# Patient Record
Sex: Female | Born: 1970 | Race: White | Hispanic: No | Marital: Married | Smoking: Never smoker
Health system: Southern US, Community
[De-identification: ages and names within clinical notes are randomized; demographics above are authoritative.]

## PROBLEM LIST (undated history)

## (undated) DIAGNOSIS — F32A Depression, unspecified: Secondary | ICD-10-CM

## (undated) DIAGNOSIS — J40 Bronchitis, not specified as acute or chronic: Secondary | ICD-10-CM

## (undated) DIAGNOSIS — F419 Anxiety disorder, unspecified: Secondary | ICD-10-CM

## (undated) DIAGNOSIS — D649 Anemia, unspecified: Secondary | ICD-10-CM

## (undated) DIAGNOSIS — F329 Major depressive disorder, single episode, unspecified: Secondary | ICD-10-CM

## (undated) HISTORY — PX: NO PAST SURGERIES: SHX2092

## (undated) HISTORY — DX: Depression, unspecified: F32.A

## (undated) HISTORY — DX: Major depressive disorder, single episode, unspecified: F32.9

## (undated) HISTORY — DX: Anxiety disorder, unspecified: F41.9

---

## 2003-03-30 ENCOUNTER — Other Ambulatory Visit: Admission: RE | Admit: 2003-03-30 | Discharge: 2003-03-30 | Payer: Self-pay | Admitting: Obstetrics and Gynecology

## 2003-04-05 ENCOUNTER — Encounter: Admission: RE | Admit: 2003-04-05 | Discharge: 2003-04-05 | Payer: Self-pay | Admitting: Obstetrics and Gynecology

## 2013-04-02 LAB — HM PAP SMEAR

## 2015-04-26 DIAGNOSIS — F329 Major depressive disorder, single episode, unspecified: Secondary | ICD-10-CM | POA: Insufficient documentation

## 2015-04-26 DIAGNOSIS — F419 Anxiety disorder, unspecified: Secondary | ICD-10-CM | POA: Insufficient documentation

## 2015-04-26 DIAGNOSIS — F32A Depression, unspecified: Secondary | ICD-10-CM | POA: Insufficient documentation

## 2015-05-16 ENCOUNTER — Encounter: Payer: Self-pay | Admitting: Family Medicine

## 2015-05-16 ENCOUNTER — Ambulatory Visit (INDEPENDENT_AMBULATORY_CARE_PROVIDER_SITE_OTHER): Payer: BLUE CROSS/BLUE SHIELD | Admitting: Family Medicine

## 2015-05-16 VITALS — BP 108/70 | HR 71 | Temp 97.5°F | Ht 64.2 in | Wt 127.0 lb

## 2015-05-16 DIAGNOSIS — L237 Allergic contact dermatitis due to plants, except food: Secondary | ICD-10-CM

## 2015-05-16 DIAGNOSIS — Z0000 Encounter for general adult medical examination without abnormal findings: Secondary | ICD-10-CM

## 2015-05-16 NOTE — Patient Instructions (Addendum)
Lactobacilis Acidophilus Health Maintenance, Female Adopting a healthy lifestyle and getting preventive care can go a long way to promote health and wellness. Talk with your health care provider about what schedule of regular examinations is right for you. This is a good chance for you to check in with your provider about disease prevention and staying healthy. In between checkups, there are plenty of things you can do on your own. Experts have done a lot of research about which lifestyle changes and preventive measures are most likely to keep you healthy. Ask your health care provider for more information. WEIGHT AND DIET  Eat a healthy diet  Be sure to include plenty of vegetables, fruits, low-fat dairy products, and lean protein.  Do not eat a lot of foods high in solid fats, added sugars, or salt.  Get regular exercise. This is one of the most important things you can do for your health.  Most adults should exercise for at least 150 minutes each week. The exercise should increase your heart rate and make you sweat (moderate-intensity exercise).  Most adults should also do strengthening exercises at least twice a week. This is in addition to the moderate-intensity exercise.  Maintain a healthy weight  Body mass index (BMI) is a measurement that can be used to identify possible weight problems. It estimates body fat based on height and weight. Your health care provider can help determine your BMI and help you achieve or maintain a healthy weight.  For females 20 years of age and older:   A BMI below 18.5 is considered underweight.  A BMI of 18.5 to 24.9 is normal.  A BMI of 25 to 29.9 is considered overweight.  A BMI of 30 and above is considered obese.  Watch levels of cholesterol and blood lipids  You should start having your blood tested for lipids and cholesterol at 43 years of age, then have this test every 5 years.  You may need to have your cholesterol levels checked  more often if:  Your lipid or cholesterol levels are high.  You are older than 43 years of age.  You are at high risk for heart disease.  CANCER SCREENING   Lung Cancer  Lung cancer screening is recommended for adults 55-80 years old who are at high risk for lung cancer because of a history of smoking.  A yearly low-dose CT scan of the lungs is recommended for people who:  Currently smoke.  Have quit within the past 15 years.  Have at least a 30-pack-year history of smoking. A pack year is smoking an average of one pack of cigarettes a day for 1 year.  Yearly screening should continue until it has been 15 years since you quit.  Yearly screening should stop if you develop a health problem that would prevent you from having lung cancer treatment.  Breast Cancer  Practice breast self-awareness. This means understanding how your breasts normally appear and feel.  It also means doing regular breast self-exams. Let your health care provider know about any changes, no matter how small.  If you are in your 20s or 30s, you should have a clinical breast exam (CBE) by a health care provider every 1-3 years as part of a regular health exam.  If you are 40 or older, have a CBE every year. Also consider having a breast X-ray (mammogram) every year.  If you have a family history of breast cancer, talk to your health care provider about genetic screening.    If you are at high risk for breast cancer, talk to your health care provider about having an MRI and a mammogram every year.  Breast cancer gene (BRCA) assessment is recommended for women who have family members with BRCA-related cancers. BRCA-related cancers include:  Breast.  Ovarian.  Tubal.  Peritoneal cancers.  Results of the assessment will determine the need for genetic counseling and BRCA1 and BRCA2 testing. Cervical Cancer Your health care provider may recommend that you be screened regularly for cancer of the pelvic  organs (ovaries, uterus, and vagina). This screening involves a pelvic examination, including checking for microscopic changes to the surface of your cervix (Pap test). You may be encouraged to have this screening done every 3 years, beginning at age 21.  For women ages 30-65, health care providers may recommend pelvic exams and Pap testing every 3 years, or they may recommend the Pap and pelvic exam, combined with testing for human papilloma virus (HPV), every 5 years. Some types of HPV increase your risk of cervical cancer. Testing for HPV may also be done on women of any age with unclear Pap test results.  Other health care providers may not recommend any screening for nonpregnant women who are considered low risk for pelvic cancer and who do not have symptoms. Ask your health care provider if a screening pelvic exam is right for you.  If you have had past treatment for cervical cancer or a condition that could lead to cancer, you need Pap tests and screening for cancer for at least 20 years after your treatment. If Pap tests have been discontinued, your risk factors (such as having a new sexual partner) need to be reassessed to determine if screening should resume. Some women have medical problems that increase the chance of getting cervical cancer. In these cases, your health care provider may recommend more frequent screening and Pap tests. Colorectal Cancer  This type of cancer can be detected and often prevented.  Routine colorectal cancer screening usually begins at 43 years of age and continues through 43 years of age.  Your health care provider may recommend screening at an earlier age if you have risk factors for colon cancer.  Your health care provider may also recommend using home test kits to check for hidden blood in the stool.  A small camera at the end of a tube can be used to examine your colon directly (sigmoidoscopy or colonoscopy). This is done to check for the earliest forms  of colorectal cancer.  Routine screening usually begins at age 50.  Direct examination of the colon should be repeated every 5-10 years through 43 years of age. However, you may need to be screened more often if early forms of precancerous polyps or small growths are found. Skin Cancer  Check your skin from head to toe regularly.  Tell your health care provider about any new moles or changes in moles, especially if there is a change in a mole's shape or color.  Also tell your health care provider if you have a mole that is larger than the size of a pencil eraser.  Always use sunscreen. Apply sunscreen liberally and repeatedly throughout the day.  Protect yourself by wearing long sleeves, pants, a wide-brimmed hat, and sunglasses whenever you are outside. HEART DISEASE, DIABETES, AND HIGH BLOOD PRESSURE   High blood pressure causes heart disease and increases the risk of stroke. High blood pressure is more likely to develop in:  People who have blood pressure in the   high end of the normal range (130-139/85-89 mm Hg).  People who are overweight or obese.  People who are African American.  If you are 54-70 years of age, have your blood pressure checked every 3-5 years. If you are 33 years of age or older, have your blood pressure checked every year. You should have your blood pressure measured twice--once when you are at a hospital or clinic, and once when you are not at a hospital or clinic. Record the average of the two measurements. To check your blood pressure when you are not at a hospital or clinic, you can use:  An automated blood pressure machine at a pharmacy.  A home blood pressure monitor.  If you are between 74 years and 67 years old, ask your health care provider if you should take aspirin to prevent strokes.  Have regular diabetes screenings. This involves taking a blood sample to check your fasting blood sugar level.  If you are at a normal weight and have a low risk  for diabetes, have this test once every three years after 43 years of age.  If you are overweight and have a high risk for diabetes, consider being tested at a younger age or more often. PREVENTING INFECTION  Hepatitis B  If you have a higher risk for hepatitis B, you should be screened for this virus. You are considered at high risk for hepatitis B if:  You were born in a country where hepatitis B is common. Ask your health care provider which countries are considered high risk.  Your parents were born in a high-risk country, and you have not been immunized against hepatitis B (hepatitis B vaccine).  You have HIV or AIDS.  You use needles to inject street drugs.  You live with someone who has hepatitis B.  You have had sex with someone who has hepatitis B.  You get hemodialysis treatment.  You take certain medicines for conditions, including cancer, organ transplantation, and autoimmune conditions. Hepatitis C  Blood testing is recommended for:  Everyone born from [birth year] through 1963.  Anyone with known risk factors for hepatitis C. Sexually transmitted infections (STIs)  You should be screened for sexually transmitted infections (STIs) including gonorrhea and chlamydia if:  You are sexually active and are younger than 43 years of age.  You are older than 43 years of age and your health care provider tells you that you are at risk for this type of infection.  Your sexual activity has changed since you were last screened and you are at an increased risk for chlamydia or gonorrhea. Ask your health care provider if you are at risk.  If you do not have HIV, but are at risk, it may be recommended that you take a prescription medicine daily to prevent HIV infection. This is called pre-exposure prophylaxis (PrEP). You are considered at risk if:  You are sexually active and do not regularly use condoms or know the HIV status of your partner(s).  You take drugs by injection.  You  are sexually active with a partner who has HIV. Talk with your health care provider about whether you are at high risk of being infected with HIV. If you choose to begin PrEP, you should first be tested for HIV. You should then be tested every 3 months for as long as you are taking PrEP.  PREGNANCY   If you are premenopausal and you may become pregnant, ask your health care provider about preconception counseling.  If  you may become pregnant, take 400 to 800 micrograms (mcg) of folic acid every day.  If you want to prevent pregnancy, talk to your health care provider about birth control (contraception). OSTEOPOROSIS AND MENOPAUSE   Osteoporosis is a disease in which the bones lose minerals and strength with aging. This can result in serious bone fractures. Your risk for osteoporosis can be identified using a bone density scan.  If you are 65 years of age or older, or if you are at risk for osteoporosis and fractures, ask your health care provider if you should be screened.  Ask your health care provider whether you should take a calcium or vitamin D supplement to lower your risk for osteoporosis.  Menopause may have certain physical symptoms and risks.  Hormone replacement therapy may reduce some of these symptoms and risks. Talk to your health care provider about whether hormone replacement therapy is right for you.  HOME CARE INSTRUCTIONS   Schedule regular health, dental, and eye exams.  Stay current with your immunizations.   Do not use any tobacco products including cigarettes, chewing tobacco, or electronic cigarettes.  If you are pregnant, do not drink alcohol.  If you are breastfeeding, limit how much and how often you drink alcohol.  Limit alcohol intake to no more than 1 drink per day for nonpregnant women. One drink equals 12 ounces of beer, 5 ounces of wine, or 1 ounces of hard liquor.  Do not use street drugs.  Do not share needles.  Ask your health care provider  for help if you need support or information about quitting drugs.  Tell your health care provider if you often feel depressed.  Tell your health care provider if you have ever been abused or do not feel safe at home.   This information is not intended to replace advice given to you by your health care provider. Make sure you discuss any questions you have with your health care provider.   Document Released: 07/16/2010 Document Revised: 01/21/2014 Document Reviewed: 12/02/2012 Elsevier Interactive Patient Education 2016 Else[employer] Heart Disease Prevention Heart disease is a leading cause of death. There are many things you can do to help prevent heart disease. BE PHYSICALLY ACTIVE Physical activity is good for your heart. It helps control your blood pressure, cholesterol levels, and weight. Try to be physically active every day. Ask your health care provider what activities are best for you.  BE A HEALTHY WEIGHT Extra weight can strain your heart and affect your blood pressure and cholesterol levels. Lose weight with diet and exercise if recommended by your health care provider. EAT HEART-HEALTHY FOODS Follow a healthy eating plan as recommended by your health care provider or dietitian. Heart-healthy foods include:   High-fiber foods. These include oat bran, oatmeal, and whole-grain breads and cereals.  Fruits and vegetables. Avoid:  Alcohol.  Fried foods.  Foods high in saturated fat. These include meats, butter, whole dairy products, shortening, and coconut or palm oil.  Salty foods. These include canned food, luncheon meat, salty snacks, and fast food. KEEP YOUR CHOLESTEROL LEVELS UNDER CONTROL Cholesterol is a substance that is used for many important functions. When your cholesterol levels are high, cholesterol can stick to the insides of your blood vessels, making them narrow or clog. This can lead to chest pain (angina) and a heart attack.  Keep your cholesterol levels  under control as recommended by your health care provider. Have your cholesterol checked at least once a year. Target cholesterol   levels (in mg/dL) for most people are:   Total cholesterol below 200.  LDL cholesterol below 100.  HDL cholesterol above 40 in men and above 50 in women.  Triglycerides below 150. KEEP YOUR BLOOD PRESSURE UNDER CONTROL Having high blood pressure (hypertension) puts you at risk for stroke and other forms of heart disease. Keep your blood pressure under control as recommended by your health care provider. Ask your health care provider if you need treatment to lower your blood pressure. If you are 21-57 years of age, have your blood pressure checked every 3-5 years. If you are 40 years of age or older, have your blood pressure checked every year. DO NOT USE TOBACCO PRODUCTS Tobacco smoke can damage your heart and blood vessels. Do not use any tobacco products including cigarettes, chewing tobacco, or electronic cigarettes. If you need help quitting, ask your health care provider. TAKE MEDICINES AS DIRECTED Take medicines only as directed by your health care provider. Ask your health care provider whether you should take an aspirin every day. Taking aspirin can help reduce your risk of heart disease and stroke.  FOR MORE INFORMATION  To find out more about heart disease, visit the American Heart Association's website at www.americanheart.org   This information is not intended to replace advice given to you by your health care provider. Make sure you discuss any questions you have with your health care provider.   Document Released: 08/15/2003 Document Revised: 01/21/2014 Document Reviewed: 02/24/2013 Elsevier Interactive Patient Education [employer].

## 2015-05-16 NOTE — Progress Notes (Signed)
BP 108/70 mmHg  Pulse 71  Temp(Src) 97.5 F (36.4 C)  Ht 5' 4.2" (1.631 m)  Wt 127 lb (57.607 kg)  BMI 21.66 kg/m2  SpO2 99%  LMP 04/21/2015 (Exact Date)   Subjective:    Patient ID: Susan Hall, female    DOB: [birth year], 45 y.o.   MRN: [id number]  HPI: Susan Hall is a 45 y.o. female presenting on 05/16/2015 for comprehensive medical examination. Current medical complaints include:none  She currently lives with: children Menopausal Symptoms: no  Depression Screen done today and results listed below:  Depression screen [facility] 2/9 05/16/2015  Decreased Interest 0  Down, Depressed, Hopeless 0  PHQ - 2 Score 0   Past Medical History:  Past Medical History  Diagnosis Date  . Anxiety   . Depression     Surgical History:  History reviewed. No pertinent past surgical history.  Medications:  No current outpatient prescriptions on file prior to visit.   No current facility-administered medications on file prior to visit.    Allergies:  Allergies  Allergen Reactions  . Doxycycline Hives  . Erythromycin Hives  . Penicillins Hives    Social History:  Social History   Social History  . Marital Status: Married    Spouse Name: N/A  . Number of Children: N/A  . Years of Education: N/A   Occupational History  . Not on file.   Social History Main Topics  . Smoking status: Never Smoker   . Smokeless tobacco: Never Used  . Alcohol Use: No  . Drug Use: No  . Sexual Activity: Yes     Comment: husband has vasectomy   Other Topics Concern  . Not on file   Social History Narrative   History  Smoking status  . Never Smoker   Smokeless tobacco  . Never Used   History  Alcohol Use No   Family History:  Family History  Problem Relation Age of Onset  . Mental illness Mother     depression  . Depression Mother   . Hypertension Mother   . Heart attack Father 52  . Heart disease Maternal Grandmother   . Hypertension Maternal Grandmother   . Hyperlipidemia  Maternal Grandmother   . Heart attack Maternal Grandfather   . Arthritis Paternal Grandmother   . Alcohol abuse Paternal Grandfather    Past medical history, surgical history, medications, allergies, family history and social history reviewed with patient today and changes made to appropriate areas of the chart.   Review of Systems  Constitutional: Negative.   HENT: Positive for congestion and ear pain (full and stuffy). Negative for ear discharge, hearing loss, nosebleeds, sore throat and tinnitus.   Eyes: Negative.   Respiratory: Negative.  Negative for stridor.   Cardiovascular: Negative.   Gastrointestinal: Positive for abdominal pain and constipation. Negative for heartburn, nausea, vomiting, diarrhea, blood in stool and melena.  Genitourinary: Negative.   Musculoskeletal: Negative.   Skin: Positive for rash (poison ivy on her arms, has been using OTC remedies). Negative for itching.  Neurological: Negative.  Negative for headaches.  Endo/Heme/Allergies: Positive for environmental allergies. Negative for polydipsia. Does not bruise/bleed easily.  Psychiatric/Behavioral: Negative.     All other ROS negative except what is listed above and in the HPI.      Objective:    BP 108/70 mmHg  Pulse 71  Temp(Src) 97.5 F (36.4 C)  Ht 5' 4.2" (1.631 m)  Wt 127 lb (57.607 kg)  BMI 21.66 kg/m2  SpO2  99%  LMP 04/21/2015 (Exact Date)  Wt Readings from Last 3 Encounters:  05/16/15 127 lb (57.607 kg)  04/02/13 145 lb (65.772 kg)    Physical Exam  Constitutional: She is oriented to person, place, and time. She appears well-developed and well-nourished. No distress.  HENT:  Head: Normocephalic and atraumatic.  Right Ear: Hearing and external ear normal.  Left Ear: Hearing and external ear normal.  Nose: Nose normal.  Mouth/Throat: Oropharynx is clear and moist. No oropharyngeal exudate.  Eyes: Conjunctivae, EOM and lids are normal. Pupils are equal, round, and reactive to light.  Right eye exhibits no discharge. Left eye exhibits no discharge. No scleral icterus.  Neck: Normal range of motion. Neck supple. No JVD present. No tracheal deviation present. No thyromegaly present.  Cardiovascular: Normal rate, regular rhythm, normal heart sounds and intact distal pulses.  Exam reveals no gallop and no friction rub.   No murmur heard. Pulmonary/Chest: Effort normal. No stridor. No respiratory distress. She has no wheezes. She has no rales. She exhibits no tenderness. Right breast exhibits no inverted nipple, no mass, no nipple discharge, no skin change and no tenderness. Left breast exhibits no inverted nipple, no mass, no nipple discharge, no skin change and no tenderness. Breasts are symmetrical.  Abdominal: Soft. Bowel sounds are normal. She exhibits no distension and no mass. There is no tenderness. There is no rebound and no guarding.  Genitourinary:  Deferred with shared decision making  Musculoskeletal: Normal range of motion. She exhibits no edema or tenderness.  Lymphadenopathy:    She has no cervical adenopathy.  Neurological: She is alert and oriented to person, place, and time. She has normal reflexes. She displays normal reflexes. No cranial nerve deficit. She exhibits normal muscle tone. Coordination normal.  Skin: Skin is warm, dry and intact. No rash noted. She is not diaphoretic. No erythema. No pallor.  Poison ivy on abdomen and arms, wart on R middle finger  Psychiatric: She has a normal mood and affect. Her speech is normal and behavior is normal. Judgment and thought content normal. Cognition and memory are normal.  Nursing note and vitals reviewed.   Results for orders placed or performed in visit on 04/26/15  HM PAP SMEAR  Result Value Ref Range   HM Pap smear per PP       Assessment & Plan:   Problem List Items Addressed This Visit    None    Visit Diagnoses    Routine general medical examination at a health care facility    -  Primary     Relevant Orders    CBC with Differential/Platelet    Comprehensive metabolic panel    Lipid Panel w/o Chol/HDL Ratio    TSH    Poison ivy dermatitis        Refused steroid cream. Will call if it gets worse.         Follow up plan: Return in about 1 year (around 05/15/2016) for Physical.   LABORATORY TESTING:  - Pap smear: up to date  IMMUNIZATIONS:   - Tdap: Tetanus vaccination status reviewed: Refused - Influenza: Refused  SCREENING: -Mammogram: Refused- will get at 5250   PATIENT COUNSELING:   Advised to take 1 mg of folate supplement per day if capable of pregnancy.   Sexuality: Discussed sexually transmitted diseases, partner selection, use of condoms, avoidance of unintended pregnancy  and contraceptive alternatives.   Advised to avoid cigarette smoking.  I discussed with the patient that most people either  abstain from alcohol or drink within safe limits (<=14/week and <=4 drinks/occasion for males, <=7/weeks and <= 3 drinks/occasion for females) and that the risk for alcohol disorders and other health effects rises proportionally with the number of drinks per week and how often a drinker exceeds daily limits.  Discussed cessation/primary prevention of drug use and availability of treatment for abuse.   Diet: Encouraged to adjust caloric intake to maintain  or achieve ideal body weight, to reduce intake of dietary saturated fat and total fat, to limit sodium intake by avoiding high sodium foods and not adding table salt, and to maintain adequate dietary potassium and calcium preferably from fresh fruits, vegetables, and low-fat dairy products.    stressed the importance of regular exercise  Injury prevention: Discussed safety belts, safety helmets, smoke detector, smoking near bedding or upholstery.   Dental health: Discussed importance of regular tooth brushing, flossing, and dental visits.    NEXT PREVENTATIVE PHYSICAL DUE IN 1 YEAR. Return in about 1 year (around  05/15/2016) for Physical.

## 2015-05-17 ENCOUNTER — Encounter: Payer: Self-pay | Admitting: Family Medicine

## 2015-05-17 LAB — CBC WITH DIFFERENTIAL/PLATELET
BASOS: 1 %
Basophils Absolute: 0.1 10*3/uL (ref 0.0–0.2)
EOS (ABSOLUTE): 0.3 10*3/uL (ref 0.0–0.4)
Eos: 4 %
HEMOGLOBIN: 13.4 g/dL (ref 11.1–15.9)
Hematocrit: 40.9 % (ref 34.0–46.6)
IMMATURE GRANS (ABS): 0 10*3/uL (ref 0.0–0.1)
IMMATURE GRANULOCYTES: 0 %
LYMPHS: 35 %
Lymphocytes Absolute: 2.4 10*3/uL (ref 0.7–3.1)
MCH: 29.3 pg (ref 26.6–33.0)
MCHC: 32.8 g/dL (ref 31.5–35.7)
MCV: 89 fL (ref 79–97)
MONOCYTES: 6 %
Monocytes Absolute: 0.4 10*3/uL (ref 0.1–0.9)
NEUTROS PCT: 54 %
Neutrophils Absolute: 3.8 10*3/uL (ref 1.4–7.0)
PLATELETS: 175 10*3/uL (ref 150–379)
RBC: 4.58 x10E6/uL (ref 3.77–5.28)
RDW: 13.2 % (ref 12.3–15.4)
WBC: 6.9 10*3/uL (ref 3.4–10.8)

## 2015-05-17 LAB — COMPREHENSIVE METABOLIC PANEL
A/G RATIO: 1.7 (ref 1.2–2.2)
ALT: 12 IU/L (ref 0–32)
AST: 17 IU/L (ref 0–40)
Albumin: 4 g/dL (ref 3.5–5.5)
Alkaline Phosphatase: 48 IU/L (ref 39–117)
BUN/Creatinine Ratio: 16 (ref 9–23)
BUN: 15 mg/dL (ref 6–24)
Bilirubin Total: <0.2 mg/dL (ref 0.0–1.2)
CALCIUM: 8.8 mg/dL (ref 8.7–10.2)
CO2: 22 mmol/L (ref 18–29)
Chloride: 100 mmol/L (ref 96–106)
Creatinine, Ser: 0.95 mg/dL (ref 0.57–1.00)
GFR, EST AFRICAN AMERICAN: 85 mL/min/{1.73_m2} (ref >59)
GFR, EST NON AFRICAN AMERICAN: 74 mL/min/{1.73_m2} (ref >59)
GLUCOSE: 97 mg/dL (ref 65–99)
Globulin, Total: 2.3 g/dL (ref 1.5–4.5)
Potassium: 3.7 mmol/L (ref 3.5–5.2)
Sodium: 139 mmol/L (ref 134–144)
TOTAL PROTEIN: 6.3 g/dL (ref 6.0–8.5)

## 2015-05-17 LAB — LIPID PANEL W/O CHOL/HDL RATIO
Cholesterol, Total: 206 mg/dL — ABNORMAL HIGH (ref 100–199)
HDL: 67 mg/dL (ref >39)
LDL Calculated: 121 mg/dL — ABNORMAL HIGH (ref 0–99)
Triglycerides: 90 mg/dL (ref 0–149)
VLDL CHOLESTEROL CAL: 18 mg/dL (ref 5–40)

## 2015-05-17 LAB — TSH: TSH: 1.1 u[IU]/mL (ref 0.450–4.500)

## 2020-03-08 ENCOUNTER — Other Ambulatory Visit: Payer: Self-pay

## 2020-03-08 ENCOUNTER — Encounter: Payer: Self-pay | Admitting: Obstetrics and Gynecology

## 2020-03-08 ENCOUNTER — Ambulatory Visit (INDEPENDENT_AMBULATORY_CARE_PROVIDER_SITE_OTHER): Payer: Self-pay | Admitting: Obstetrics and Gynecology

## 2020-03-08 ENCOUNTER — Other Ambulatory Visit (HOSPITAL_COMMUNITY)
Admission: RE | Admit: 2020-03-08 | Discharge: 2020-03-08 | Disposition: A | Discharge status: Home / Self Care | Payer: Self-pay | Source: Ambulatory Visit | Attending: Obstetrics and Gynecology | Admitting: Obstetrics and Gynecology

## 2020-03-08 VITALS — BP 118/70 | Ht 65.0 in | Wt 141.4 lb

## 2020-03-08 DIAGNOSIS — Z124 Encounter for screening for malignant neoplasm of cervix: Secondary | ICD-10-CM | POA: Insufficient documentation

## 2020-03-08 DIAGNOSIS — N921 Excessive and frequent menstruation with irregular cycle: Secondary | ICD-10-CM

## 2020-03-08 DIAGNOSIS — Z1231 Encounter for screening mammogram for malignant neoplasm of breast: Secondary | ICD-10-CM

## 2020-03-08 NOTE — Patient Instructions (Signed)
Menorrhagia Menorrhagia is a form of abnormal uterine bleeding in which menstrual periods are heavy or last longer than normal. With menorrhagia, the periods may cause enough blood loss and cramping that a woman becomes unable to take part in her usual activities. What are the causes? Common causes of this condition include:  Polyps or fibroids. These are noncancerous growths in the uterus.  An imbalance of the hormones estrogen and progesterone.  Anovulation, which occurs when one of the ovaries does not release an egg during one or more months.  A problem with the thyroid gland (hypothyroidism).  Side effects of having an intrauterine device (IUD).  Side effects of some medicines, such as NSAIDs or blood thinners.  A bleeding disorder that stops the blood from clotting normally. In some cases, the cause of this condition is not known. What increases the risk? You are more likely to develop this condition if you have cancer of the uterus. What are the signs or symptoms? Symptoms of this condition include:  Routinely having to change your pad or tampon every 1-2 hours because it is soaked.  Needing to use pads and tampons at the same time because of heavy bleeding.  Needing to wake up to change your pads or tampons during the night.  Passing blood clots larger than 1 inch (2.5 cm) in size.  Having bleeding that lasts for more than 7 days.  Having symptoms of low iron levels (anemia), such as tiredness (fatigue) or shortness of breath. How is this diagnosed? This condition may be diagnosed based on:  A physical exam.  Your symptoms and menstrual history.  Tests, such as: ? Blood tests to check if you are pregnant or if you have hormonal changes, a bleeding or thyroid disorder, anemia, or other problems. ? Pap test to check for cancerous changes, infections, or inflammation. ? Endometrial biopsy. This test involves removing a tissue sample from the lining of the uterus  (endometrium) to be examined under a microscope. ? Pelvic ultrasound. This test uses sound waves to create images of your uterus, ovaries, and vagina. The images can show if you have fibroids or other growths. ? Hysteroscopy. For this test, a thin, flexible tube with a light on the end (hysteroscope) is used to look inside your uterus. How is this treated? Treatment may not be needed for this condition. If it is needed, the best treatment for you will depend on:  Whether you need to prevent pregnancy.  Your desire to have children in the future.  The cause and severity of your bleeding.  Your personal preference. Medicine Medicines are the first step in treatment. You may be treated with:  Hormonal birth control methods. These treatments reduce bleeding during your menstrual period. They include: ? Birth control pills. ? Skin patch. ? Vaginal ring. ? Shots (injections) that you get every 3 months. ? Hormonal IUD. ? Implants that go under the skin.  Medicines that thicken the blood and slow bleeding.  Medicines that reduce swelling, such as ibuprofen.  Medicines that contain an artificial (synthetic) hormone called progestin.  Medicines that make the ovaries stop working for a short time.  Iron supplements to treat anemia.   Surgery If medicines do not work, surgery may be done. Surgical options may include:  Dilation and curettage (D&C). In this procedure, your health care provider opens the lowest part of the uterus (cervix) and then scrapes or suctions tissue from the endometrium. This reduces menstrual bleeding.  Operative hysteroscopy. In this procedure,   a hysteroscope is used to view your uterus and help remove polyps that may be causing heavy periods.  Endometrial ablation. This is when various techniques are used to permanently destroy your entire endometrium. After endometrial ablation, most women have little or no menstrual flow. This procedure reduces your ability to  become pregnant.  Endometrial resection. In this procedure, an electrosurgical wire loop is used to remove the endometrium. This procedure reduces your ability to become pregnant.  Hysterectomy. This is surgical removal of your uterus. This is a permanent procedure that stops menstrual periods. Pregnancy is not possible after a hysterectomy. Follow these instructions at home: Medicines  Take over-the-counter and prescription medicines only as told by your health care provider. This includes iron pills.  Do not change or switch medicines without asking your health care provider.  Do not take aspirin or medicines that contain aspirin 1 week before or during your menstrual period. Aspirin may make bleeding worse. Managing constipation Your iron pills may cause constipation. If you are taking prescription iron supplements, you may need to take these actions to prevent or treat constipation:  Drink enough fluid to keep your urine pale yellow.  Take over-the-counter or prescription medicines.  Eat foods that are high in fiber, such as beans, whole grains, and fresh fruits and vegetables.  Limit foods that are high in fat and processed sugars, such as fried or sweet foods. General instructions  If you need to change your sanitary pad or tampon more than once every 2 hours, limit your activity until the bleeding stops.  Eat well-balanced meals, including foods that are high in iron. Foods that have a lot of iron include leafy green vegetables, meat, liver, eggs, and whole-grain breads and cereals.  Do not try to lose weight until the abnormal bleeding has stopped and your blood iron level is back to normal. If you need to lose weight, work with your health care provider to lose weight safely.  Keep all follow-up visits. This is important. Contact a health care provider if:  You soak through a pad or tampon every 1 or 2 hours, and this happens every time you have a period.  You need to use  pads and tampons at the same time because you are bleeding so much.  You have nausea, vomiting, diarrhea, or other problems related to medicines you are taking. Get help right away if:  You soak through more than a pad or tampon in 1 hour.  You pass clots bigger than 1 inch (2.5 cm) wide.  You feel short of breath.  You feel like your heart is beating too fast.  You feel dizzy or you faint.  You feel very weak or tired. Summary  Menorrhagia is a form of abnormal uterine bleeding in which menstrual periods are heavy or last longer than normal.  Treatment may not be needed for this condition. If it is needed, it may include medicines or procedures.  Take over-the-counter and prescription medicines only as told by your health care provider. This includes iron pills.  Get help right away if you have heavy bleeding that soaks through more than a pad or tampon in 1 hour, you pass large clots, or you feel dizzy, short of breath, or very weak or tired. This information is not intended to replace advice given to you by your health care provider. Make sure you discuss any questions you have with your health care provider. Document Revised: 09/14/2019 Document Reviewed: 09/14/2019 Elsevier Patient Education  2021   Elsevier Inc.  

## 2020-03-08 NOTE — Progress Notes (Signed)
Patient ID: Susan Hall, female   DOB: [birth year], 50 y.o.   MRN: [id number]  Reason for Consult: No chief complaint on file.   Referred by Dorcas Carrow, DO  Subjective:     HPI:  Susan Hall is a 50 y.o. female.  She is here today regarding consultation for irregular menstrual cycle and heavy menstrual bleeding.  She reports that for most of her life she had a regular menstrual cycle.  Her menstrual cycle occurred every 27 to 28 days.  She would generally have 3 to 5 days of bleeding.  Her menstrual cycle began to change at the beginning of 2021.  She reports that her periods became closer together.  Her menstrual cycle occurred every 24 days then every 21 and then every 16.  She reports by the end of 2021 all hell broke loose.  She reports that the bleeding became very random and prolonged for as long as 3 weeks.  She reports that the month of December and January her bleeding was the worst it was very heavy.  She reports that during those months she was having bleeding that required her to change her menstrual pad every 2 hours.  She reports that she is passing clots the size of half of her palm.  She has not previously been evaluated for this irregular bleeding.  She has not previously tried any treatments for this regular bleeding.  Gynecological History Menarche: 12 Menopause: Not applicable LMP: 02/14/2020 Describes periods as heavy and irregular Last pap smear: Unknown perhaps 2014.  Denies history of abnormal Pap smears. Last Mammogram: Unknown perhaps 2014. History of STDs: Denies  She denies any history of fibroids polyps ovarian cyst PCOS or endometriosis. She identifies as female is sexually active with men.  Obstetrical History OB History  Gravida Para Term Preterm AB Living  2 2 2     2   SAB IAB Ectopic Multiple Live Births          2    # Outcome Date GA Lbr Len/2nd Weight Sex Delivery Anes PTL Lv  2 Term 08/10/96    M Vag-Spont   LIV  1 Term 07/1900    08/1900   LIV     Past Medical History:  Diagnosis Date  . Anxiety   . Depression    Family History  Problem Relation Age of Onset  . Mental illness Mother        depression  . Depression Mother   . Hypertension Mother   . Heart attack Father 61  . Heart disease Maternal Grandmother   . Hypertension Maternal Grandmother   . Hyperlipidemia Maternal Grandmother   . Heart attack Maternal Grandfather   . Arthritis Paternal Grandmother   . Alcohol abuse Paternal Grandfather    History reviewed. No pertinent surgical history.  Short Social History:  Social History   Tobacco Use  . Smoking status: Never Smoker  . Smokeless tobacco: Never Used  Substance Use Topics  . Alcohol use: No    Allergies  Allergen Reactions  . Doxycycline Hives  . Erythromycin Hives  . Penicillins Hives    No current outpatient medications on file.   No current facility-administered medications for this visit.    Review of Systems  Constitutional: Positive for fatigue. Negative for chills, fever and unexpected weight change.  HENT: Negative for trouble swallowing.  Eyes: Negative for loss of vision.  Respiratory: Negative for cough, shortness of breath and wheezing.  Cardiovascular:  Negative for chest pain, leg swelling, palpitations and syncope.  GI: Negative for abdominal pain, blood in stool, diarrhea, nausea and vomiting.  GU: Negative for difficulty urinating, dysuria, frequency and hematuria.  Musculoskeletal: Negative for back pain, leg pain and joint pain.  Skin: Negative for rash.  Neurological: Negative for dizziness, headaches, light-headedness, numbness and seizures.  Psychiatric: Negative for behavioral problem, confusion, depressed mood and sleep disturbance.        Objective:  Objective   Vitals:   03/08/20 1055  BP: 118/70  Weight: 141 lb 6.4 oz (64.1 kg)  Height: 5\' 5"  (1.651 m)   Body mass index is 23.53 kg/m.  Physical Exam Vitals and nursing note  reviewed.  Constitutional:      Appearance: She is well-developed and well-nourished.  HENT:     Head: Normocephalic and atraumatic.  Eyes:     Extraocular Movements: EOM normal.     Pupils: Pupils are equal, round, and reactive to light.  Cardiovascular:     Rate and Rhythm: Normal rate and regular rhythm.  Pulmonary:     Effort: Pulmonary effort is normal. No respiratory distress.  Genitourinary:    Comments: External: Normal appearing vulva. No lesions noted. Evidence of stage 2 anterior and apical prolapse on exam.  Speculum examination: Normal appearing cervix. No blood in the vaginal vault. No discharge.   Bimanual examination: Uterus midline, non-tender, normal in size, shape and contour.  No CMT. No adnexal masses. No adnexal tenderness. Pelvis not fixed.   Normal bilateral breast exam Skin:    General: Skin is warm and dry.  Neurological:     Mental Status: She is alert and oriented to person, place, and time.  Psychiatric:        Mood and Affect: Mood and affect normal.        Behavior: Behavior normal.        Thought Content: Thought content normal.        Judgment: Judgment normal.     Assessment/Plan:     50 year old with menorrhagia and irregular menstrual cycle. 1.cervical cancer screening -pap smear performed today. 2. Mammogram encouraged and ordered. 3. Discussed evaluation of menorrhagia and initial management.  She will follow up for pelvic 57 and EMB.  4. Anterior and apical prolapse stage 2. Discussed prolapse- patient is without symptoms at this time. Provided with informational  handouts.    More than 45 minutes were spent face to face with the patient in the room, reviewing the medical record, labs and images, and coordinating care for the patient. The plan of management was discussed in detail and counseling was provided.    [place] MD [facility], [facility] 03/08/2020 11:41 AM

## 2020-03-10 LAB — CYTOLOGY - PAP
Comment: NEGATIVE
Diagnosis: NEGATIVE
High risk HPV: NEGATIVE

## 2020-03-22 ENCOUNTER — Encounter: Payer: Self-pay | Admitting: Obstetrics and Gynecology

## 2020-03-22 ENCOUNTER — Ambulatory Visit (INDEPENDENT_AMBULATORY_CARE_PROVIDER_SITE_OTHER): Payer: Self-pay

## 2020-03-22 ENCOUNTER — Other Ambulatory Visit: Payer: Self-pay

## 2020-03-22 ENCOUNTER — Telehealth: Payer: Self-pay

## 2020-03-22 ENCOUNTER — Ambulatory Visit (INDEPENDENT_AMBULATORY_CARE_PROVIDER_SITE_OTHER): Payer: Self-pay | Admitting: Obstetrics and Gynecology

## 2020-03-22 VITALS — BP 110/70 | Ht 65.0 in | Wt 144.6 lb

## 2020-03-22 DIAGNOSIS — N921 Excessive and frequent menstruation with irregular cycle: Secondary | ICD-10-CM

## 2020-03-22 DIAGNOSIS — N83202 Unspecified ovarian cyst, left side: Secondary | ICD-10-CM

## 2020-03-22 NOTE — Telephone Encounter (Signed)
Pt calling; has u/s this pm; can she eat and drink normally?  [phone]  Per Abby, pt aware there is no prep.

## 2020-03-23 ENCOUNTER — Encounter: Payer: Self-pay | Admitting: Obstetrics and Gynecology

## 2020-03-23 NOTE — Progress Notes (Signed)
Patient ID: Susan Hall, female   DOB: 1970-07-10, 50 y.o.   MRN: [id number]  Reason for Consult: Gynecologic Exam   Referred by Dorcas Carrow, DO  Subjective:     HPI:  Susan Hall is a 50 y.o. female. She is following up today for a pelvic US as part of evaluation for her AUB which was previously described. She reports she has not had bleeding in 5 weeks.    Past Medical History:  Diagnosis Date  . Anxiety   . Depression    Family History  Problem Relation Age of Onset  . Mental illness Mother        depression  . Depression Mother   . Hypertension Mother   . Heart attack Father 87  . Heart disease Maternal Grandmother   . Hypertension Maternal Grandmother   . Hyperlipidemia Maternal Grandmother   . Heart attack Maternal Grandfather   . Arthritis Paternal Grandmother   . Alcohol abuse Paternal Grandfather    No past surgical history on file.  Short Social History:  Social History   Tobacco Use  . Smoking status: Never Smoker  . Smokeless tobacco: Never Used  Substance Use Topics  . Alcohol use: No    Allergies  Allergen Reactions  . Doxycycline Hives  . Erythromycin Hives  . Penicillins Hives    No current outpatient medications on file.   No current facility-administered medications for this visit.    Review of Systems  Constitutional: Negative for chills, fatigue, fever and unexpected weight change.  HENT: Negative for trouble swallowing.  Eyes: Negative for loss of vision.  Respiratory: Negative for cough, shortness of breath and wheezing.  Cardiovascular: Negative for chest pain, leg swelling, palpitations and syncope.  GI: Negative for abdominal pain, blood in stool, diarrhea, nausea and vomiting.  GU: Negative for difficulty urinating, dysuria, frequency and hematuria.  Musculoskeletal: Negative for back pain, leg pain and joint pain.  Skin: Negative for rash.  Neurological: Negative for dizziness, headaches, light-headedness,  numbness and seizures.  Psychiatric: Negative for behavioral problem, confusion, depressed mood and sleep disturbance.        Objective:  Objective   Vitals:   03/22/20 1607  BP: 110/70  Weight: 144 lb 9.6 oz (65.6 kg)  Height: 5\' 5"  (1.651 m)   Body mass index is 24.06 kg/m.  Physical Exam Vitals and nursing note reviewed. Exam conducted with a chaperone present.  Constitutional:      Appearance: Normal appearance.  HENT:     Head: Normocephalic and atraumatic.  Eyes:     Extraocular Movements: Extraocular movements intact.     Pupils: Pupils are equal, round, and reactive to light.  Cardiovascular:     Rate and Rhythm: Normal rate and regular rhythm.  Pulmonary:     Effort: Pulmonary effort is normal.     Breath sounds: Normal breath sounds.  Abdominal:     General: Abdomen is flat.     Palpations: Abdomen is soft.  Musculoskeletal:     Cervical back: Normal range of motion.  Skin:    General: Skin is warm and dry.  Neurological:     General: No focal deficit present.     Mental Status: She is alert and oriented to person, place, and time.  Psychiatric:        Behavior: Behavior normal.        Thought Content: Thought content normal.        Judgment: Judgment normal.  Assessment/Plan:     50 yo with abnormal uterine bleeding and left ovarian cyst with septation 1. Abnormal uterine bleeding (AUB) - given the patient age, abnormal bleeding pattern and endometrial thickness I recommneded an endometrial biopsy. Deliliah declined biopsy today. She feels that she ultimately will consider the biopsy, but she would like to do so after her follow up ultrasound for the left ovarian cyst. We discussed that biopsy is recomended given her age to help exclude hyperplasia and malignancy as the cause of abnormal uterine bleeding.   Per ACOG Practice Bulletin 128: The primary role of endometrial sampling in patients with AUB is to determine whether carcinoma or premalignant  lesions are present, although other pathology related to bleeding may be found. Endometrial tissue sampling should be performed in patients with AUB who are older than 45 years as a first-line test.   2. Left ovarian cyst withone thin septation- repeat ultrasound in 6-12 weeks to ensure resolution. Patient has not been having pain or symptoms of this cyst.    More than 20 minutes were spent face to face with the patient in the room, reviewing the medical record, labs and images, and coordinating care for the patient. The plan of management was discussed in detail and counseling was provided.    Adelene Idler MD [facility], [facility] 03/23/2020 6:08 PM

## 2020-05-03 ENCOUNTER — Other Ambulatory Visit (HOSPITAL_COMMUNITY)
Admission: RE | Admit: 2020-05-03 | Discharge: 2020-05-03 | Disposition: A | Discharge status: Home / Self Care | Payer: Self-pay | Source: Ambulatory Visit | Attending: Obstetrics and Gynecology | Admitting: Obstetrics and Gynecology

## 2020-05-03 ENCOUNTER — Ambulatory Visit (INDEPENDENT_AMBULATORY_CARE_PROVIDER_SITE_OTHER): Payer: Self-pay

## 2020-05-03 ENCOUNTER — Ambulatory Visit (INDEPENDENT_AMBULATORY_CARE_PROVIDER_SITE_OTHER): Payer: Self-pay | Admitting: Obstetrics and Gynecology

## 2020-05-03 ENCOUNTER — Encounter: Payer: Self-pay | Admitting: Obstetrics and Gynecology

## 2020-05-03 ENCOUNTER — Other Ambulatory Visit: Payer: Self-pay

## 2020-05-03 VITALS — BP 128/72 | Ht 65.0 in | Wt 141.2 lb

## 2020-05-03 DIAGNOSIS — N921 Excessive and frequent menstruation with irregular cycle: Secondary | ICD-10-CM

## 2020-05-03 DIAGNOSIS — N83202 Unspecified ovarian cyst, left side: Secondary | ICD-10-CM

## 2020-05-03 NOTE — Progress Notes (Signed)
Patient ID: Susan Hall, female   DOB: 1970/01/21, 50 y.o.   MRN: [id number]  Reason for Consult: Gynecologic Exam   Referred by Dorcas Carrow, DO  Subjective:     HPI:  Susan Hall is a 50 y.o. female. She is following up today for repeat pelvic US. Her presenting complaint was heavy and irregular bleeding. She reports today that she had bleeding for 3-4 days at the beginning of February which was very heavy, then did not have bleeding again until the end of march. At the end of march she had bleeding " which was more similar to a normal period" in terms of heaviness, but lasted for 2 weeks and stopped somewhat recently.   Her prior US had showed a left complex ovarian cyst and we had plan a short interval follow up.    Past Medical History:  Diagnosis Date  . Anxiety   . Depression    Family History  Problem Relation Age of Onset  . Mental illness Mother        depression  . Depression Mother   . Hypertension Mother   . Heart attack Father 25  . Heart disease Maternal Grandmother   . Hypertension Maternal Grandmother   . Hyperlipidemia Maternal Grandmother   . Heart attack Maternal Grandfather   . Arthritis Paternal Grandmother   . Alcohol abuse Paternal Grandfather    No past surgical history on file.  Short Social History:  Social History   Tobacco Use  . Smoking status: Never Smoker  . Smokeless tobacco: Never Used  Substance Use Topics  . Alcohol use: No    Allergies  Allergen Reactions  . Doxycycline Hives  . Erythromycin Hives  . Penicillins Hives    No current outpatient medications on file.   No current facility-administered medications for this visit.    Review of Systems  Constitutional: Negative for chills, fatigue, fever and unexpected weight change.  HENT: Negative for trouble swallowing.  Eyes: Negative for loss of vision.  Respiratory: Negative for cough, shortness of breath and wheezing.  Cardiovascular: Negative for chest  pain, leg swelling, palpitations and syncope.  GI: Negative for abdominal pain, blood in stool, diarrhea, nausea and vomiting.  GU: Negative for difficulty urinating, dysuria, frequency and hematuria.  Musculoskeletal: Negative for back pain, leg pain and joint pain.  Skin: Negative for rash.  Neurological: Negative for dizziness, headaches, light-headedness, numbness and seizures.  Psychiatric: Negative for behavioral problem, confusion, depressed mood and sleep disturbance.        Objective:  Objective   Vitals:   05/03/20 1616  BP: 128/72  Weight: 141 lb 3.2 oz (64 kg)  Height: 5\' 5"  (1.651 m)   Body mass index is 23.5 kg/m.  Physical Exam Vitals and nursing note reviewed. Exam conducted with a chaperone present.  Constitutional:      Appearance: Normal appearance. She is well-developed.  HENT:     Head: Normocephalic and atraumatic.  Eyes:     Extraocular Movements: Extraocular movements intact.     Pupils: Pupils are equal, round, and reactive to light.  Cardiovascular:     Rate and Rhythm: Normal rate and regular rhythm.  Pulmonary:     Effort: Pulmonary effort is normal. No respiratory distress.     Breath sounds: Normal breath sounds.  Abdominal:     General: Abdomen is flat.     Palpations: Abdomen is soft.  Genitourinary:    Comments: External: Normal appearing vulva. No lesions  noted.  Speculum examination: Normal appearing cervix. No blood in the vaginal vault. no discharge.  no IUD strings seen Bimanual examination: Uterus midline, non-tender, normal in size, shape and contour.  No CMT. Right feels slightly larger than left. Adnexa is mobile. No adnexal tenderness. Pelvis not fixed. Musculoskeletal:        General: No signs of injury.  Skin:    General: Skin is warm and dry.  Neurological:     Mental Status: She is alert and oriented to person, place, and time.  Psychiatric:        Behavior: Behavior normal.        Thought Content: Thought content  normal.        Judgment: Judgment normal.     Endometrial Biopsy After discussion with the patient regarding her abnormal uterine bleeding I recommended that she proceed with an endometrial biopsy for further diagnosis. The risks, benefits, alternatives, and indications for an endometrial biopsy were discussed with the patient in detail. She understood the risks including infection, bleeding, cervical laceration and uterine perforation.  Verbal consent was obtained.   PROCEDURE NOTE:  Pipelle endometrial biopsy was performed using aseptic technique with iodine preparation.  The uterus was sounded to a length of 8 cm.  Adequate sampling was obtained with minimal blood loss.  The patient tolerated the procedure well.  Disposition will be pending pathology.   Assessment/Plan:     50 yo 1. Abnormal uterine bleeding- Endometrial biopsy performed today 2. Left ovarian cyst: Given the short interval increase in size and the development of additional septations I recommended that the ovarian cyst be removed. Will check ROMA.   More than 30 minutes were spent face to face with the patient in the room, reviewing the medical record, labs and images, and coordinating care for the patient. The plan of management was discussed in detail and counseling was provided.   Follow up on these results with appointment next week and discuss next steps.    Adelene Idler MD [facility], [facility] 05/03/2020 9:19 PM

## 2020-05-04 LAB — OVARIAN MALIGNANCY RISK-ROMA
Cancer Antigen (CA) 125: 11.6 U/mL (ref 0.0–38.1)
HE4: 58.7 pmol/L (ref 0.0–63.6)
Postmenopausal ROMA: 1.13
Premenopausal ROMA: 1.04

## 2020-05-04 LAB — PREMENOPAUSAL INTERP: LOW

## 2020-05-04 LAB — POSTMENOPAUSAL INTERP: LOW

## 2020-05-05 LAB — SURGICAL PATHOLOGY

## 2020-05-12 ENCOUNTER — Encounter: Payer: Self-pay | Admitting: Obstetrics and Gynecology

## 2020-05-12 ENCOUNTER — Other Ambulatory Visit: Payer: Self-pay

## 2020-05-12 ENCOUNTER — Ambulatory Visit (INDEPENDENT_AMBULATORY_CARE_PROVIDER_SITE_OTHER): Payer: Self-pay | Admitting: Obstetrics and Gynecology

## 2020-05-12 VITALS — BP 100/60 | Ht 65.0 in | Wt 141.0 lb

## 2020-05-12 DIAGNOSIS — N921 Excessive and frequent menstruation with irregular cycle: Secondary | ICD-10-CM

## 2020-05-12 DIAGNOSIS — N83202 Unspecified ovarian cyst, left side: Secondary | ICD-10-CM

## 2020-05-12 NOTE — Progress Notes (Signed)
Patient ID: Susan Hall, female   DOB: 1970-02-26, 50 y.o.   MRN: [id number]  Reason for Consult: Follow-up   Referred by Dorcas Carrow, DO  Subjective:     HPI:  Susan Hall is a 50 y.o. female. She is following up today after endometrial biopsy and labs for her ovarian cyst. She reports she had another period, but that the flow was much more normal.    Past Medical History:  Diagnosis Date  . Anxiety   . Depression    Family History  Problem Relation Age of Onset  . Mental illness Mother        depression  . Depression Mother   . Hypertension Mother   . Heart attack Father 50  . Heart disease Maternal Grandmother   . Hypertension Maternal Grandmother   . Hyperlipidemia Maternal Grandmother   . Heart attack Maternal Grandfather   . Arthritis Paternal Grandmother   . Alcohol abuse Paternal Grandfather    Past Surgical History:  Procedure Laterality Date  . NO PAST SURGERIES      Short Social History:  Social History   Tobacco Use  . Smoking status: Never Smoker  . Smokeless tobacco: Never Used  Substance Use Topics  . Alcohol use: No    Allergies  Allergen Reactions  . Doxycycline Hives  . Erythromycin Hives  . Penicillins Hives    No current outpatient medications on file.   No current facility-administered medications for this visit.    Review of Systems  Constitutional: Negative for chills, fatigue, fever and unexpected weight change.  HENT: Negative for trouble swallowing.  Eyes: Negative for loss of vision.  Respiratory: Negative for cough, shortness of breath and wheezing.  Cardiovascular: Negative for chest pain, leg swelling, palpitations and syncope.  GI: Negative for abdominal pain, blood in stool, diarrhea, nausea and vomiting.  GU: Negative for difficulty urinating, dysuria, frequency and hematuria.  Musculoskeletal: Negative for back pain, leg pain and joint pain.  Skin: Negative for rash.  Neurological: Negative for  dizziness, headaches, light-headedness, numbness and seizures.  Psychiatric: Negative for behavioral problem, confusion, depressed mood and sleep disturbance.        Objective:  Objective   Vitals:   05/12/20 1623  BP: 100/60  Weight: 141 lb (64 kg)  Height: 5\' 5"  (1.651 m)   Body mass index is 23.46 kg/m.  Physical Exam Vitals and nursing note reviewed. Exam conducted with a chaperone present.  Constitutional:      Appearance: Normal appearance.  HENT:     Head: Normocephalic and atraumatic.  Eyes:     Extraocular Movements: Extraocular movements intact.     Pupils: Pupils are equal, round, and reactive to light.  Cardiovascular:     Rate and Rhythm: Normal rate and regular rhythm.  Pulmonary:     Effort: Pulmonary effort is normal.     Breath sounds: Normal breath sounds.  Abdominal:     General: Abdomen is flat.     Palpations: Abdomen is soft.  Musculoskeletal:     Cervical back: Normal range of motion.  Skin:    General: Skin is warm and dry.  Neurological:     General: No focal deficit present.     Mental Status: She is alert and oriented to person, place, and time.  Psychiatric:        Behavior: Behavior normal.        Thought Content: Thought content normal.  Judgment: Judgment normal.     Assessment/Plan:     50 yo with abnormal uterine bleeding and left ovarian cyst.  1. Abnormal uterine bleeding- Endometrial biopsy normal. We discussed options for medical management of AUB including Provera, IUD, or D&C.  She will consider.   2. Left ovarian cyst- IOTA score (below) suggestive of most likely benign cyst. I offered the patient surgical removal of the ovarian cyst based on the size, interval growth, and risk of torsion. We discussed that early stage ovarian malignancies can have normal CA125 levels. She prefers to wait and would like to follow the cyst up with another [place] in 12 weeks.   She prefers to address her bleeding and the ovarian cyst with  dietary changes and healthy lifestyle habits although she is okay with monitoring or these issues and will return if her symptoms worsen.   More than 20 minutes were spent face to face with the patient in the room, reviewing the medical record, labs and images, and coordinating care for the patient. The plan of management was discussed in detail and counseling was provided.   Adelene Idler MD, [facility], [facility] 05/12/2020 4:51 PM

## 2020-06-15 ENCOUNTER — Ambulatory Visit: Payer: Self-pay

## 2020-06-16 ENCOUNTER — Ambulatory Visit: Payer: Self-pay | Admitting: Obstetrics and Gynecology

## 2020-07-11 ENCOUNTER — Emergency Department
Admission: EM | Admit: 2020-07-11 | Discharge: 2020-07-11 | Disposition: A | Discharge status: Home / Self Care | Payer: Self-pay | Attending: Emergency Medicine | Admitting: Emergency Medicine

## 2020-07-11 ENCOUNTER — Other Ambulatory Visit: Payer: Self-pay

## 2020-07-11 ENCOUNTER — Emergency Department: Payer: Self-pay

## 2020-07-11 ENCOUNTER — Encounter: Payer: Self-pay | Admitting: *Deleted

## 2020-07-11 DIAGNOSIS — W19XXXA Unspecified fall, initial encounter: Secondary | ICD-10-CM | POA: Insufficient documentation

## 2020-07-11 DIAGNOSIS — S43401A Unspecified sprain of right shoulder joint, initial encounter: Secondary | ICD-10-CM | POA: Insufficient documentation

## 2020-07-11 DIAGNOSIS — Y93K1 Activity, walking an animal: Secondary | ICD-10-CM | POA: Insufficient documentation

## 2020-07-11 NOTE — ED Provider Notes (Signed)
[facility] Emergency Department Provider Note ____________________________________________  Time seen: Approximately 8:01 PM  I have reviewed the triage vital signs and the nursing notes.   HISTORY  Chief Complaint Shoulder Pain    HPI Susan Hall is a 50 y.o. female who presents to the emergency department for evaluation and treatment of right shoulder pain after being pulled down while walking her dog on its leash.  Injury occurred at home just prior to arrival.  No alleviating measures attempted  Past Medical History:  Diagnosis Date   Anxiety    Depression     Patient Active Problem List   Diagnosis Date Noted   Anxiety    Depression     Past Surgical History:  Procedure Laterality Date   NO PAST SURGERIES      Prior to Admission medications   Not on File    Allergies Doxycycline, Erythromycin, and Penicillins  Family History  Problem Relation Age of Onset   Mental illness Mother        depression   Depression Mother    Hypertension Mother    Heart attack Father 74   Heart disease Maternal Grandmother    Hypertension Maternal Grandmother    Hyperlipidemia Maternal Grandmother    Heart attack Maternal Grandfather    Arthritis Paternal Grandmother    Alcohol abuse Paternal Grandfather     Social History Social History   Tobacco Use   Smoking status: Never   Smokeless tobacco: Never  Vaping Use   Vaping Use: Never used  Substance Use Topics   Alcohol use: No   Drug use: No    Review of Systems Constitutional: Negative for fever. Cardiovascular: Negative for chest pain. Respiratory: Negative for shortness of breath. Musculoskeletal: Positive for right shoulder pain Skin: Abrasions to the superior shoulder.  Neurological: Negative for decrease in sensation  ____________________________________________   PHYSICAL EXAM:  VITAL SIGNS: ED Triage Vitals [07/11/20 1930]  Enc Vitals Group     BP 140/85     Pulse  Rate 73     Resp 20     Temp 98.9 F (37.2 C)     Temp Source Oral     SpO2 100 %     Weight      Height      Head Circumference      Peak Flow      Pain Score      Pain Loc      Pain Edu?      Excl. in GC?     Constitutional: Alert and oriented. Well appearing and in no acute distress. Eyes: Conjunctivae are clear without discharge or drainage Head: Atraumatic Neck: Supple.  No focal midline tenderness. Respiratory: No cough. Respirations are even and unlabored. Musculoskeletal: Tenderness over the superior and anterior aspect of the right shoulder.  Patient is able to demonstrate range of motion with increase in pain with external rotation.  Painful arc but no drop. Neurologic: Motor and sensory function is intact. Skin: No open wounds or lesions overlying the right arm Psychiatric: Affect and behavior are appropriate.  ____________________________________________   LABS (all labs ordered are listed, but only abnormal results are displayed)  Labs Reviewed - No data to display ____________________________________________  RADIOLOGY  No evidence of dislocation or fracture on image of the right shoulder  I, Narayan Scull, personally viewed and evaluated these images (plain radiographs) as part of my medical decision making, as well as reviewing the written report by the radiologist.  DG Shoulder Right  Result Date: 07/11/2020 CLINICAL DATA:  Shoulder pain EXAM: RIGHT SHOULDER - 2+ VIEW COMPARISON:  None. FINDINGS: There is no evidence of fracture or dislocation. There is no evidence of arthropathy or other focal bone abnormality. Soft tissues are unremarkable. IMPRESSION: Negative. Electronically Signed   By: Jasmine Pang M.D.   On: 07/11/2020 20:13   ____________________________________________   PROCEDURES  Procedures  ____________________________________________   INITIAL IMPRESSION / ASSESSMENT AND PLAN / ED COURSE  Susan Hall is a 50 y.o. who presents to  the emergency department for treatment and evaluation of right shoulder pain after being pulled down to the ground by her dog.  See HPI for further details.  X-ray of the right shoulder does not show any evidence of dislocation.  Patient is in obvious pain.  CT of the shoulder for further evaluation was offered, however the patient declines images.  She also declines any pain medication and states that she will take ibuprofen when she gets home.  She will be placed in a shoulder immobilizer.  Patient instructed to follow-up with orthopedics if not improving over the week.  She was also instructed to return to the emergency department for symptoms that change or worsen if unable schedule an appointment with orthopedics or primary care.  Medications - No data to display  Pertinent labs & imaging results that were available during my care of the patient were reviewed by me and considered in my medical decision making (see chart for details).   _________________________________________   FINAL CLINICAL IMPRESSION(S) / ED DIAGNOSES  Final diagnoses:  Sprain of right shoulder, unspecified shoulder sprain type, initial encounter    ED Discharge Orders     None        If controlled substance prescribed during this visit, 12 month history viewed on the [facility] prior to issuing an initial prescription for Schedule II or III opiod.    Chinita Pester, FNP 07/11/20 2215    Delton Prairie, MD 07/11/20 [phone]

## 2020-07-11 NOTE — ED Triage Notes (Signed)
Pt reports she was walking dog with leash, dog yanked the leash, she felt a pull, fell down. She has right shoulder pain. Palpable pulse.

## 2020-07-11 NOTE — Discharge Instructions (Addendum)
Please use ice off and on throughout the day.  Take tylenol and ibuprofen in rotation.  Follow up with orthopedics. Call tomorrow for an appointment.  Return to the ER for symptoms that change or worsen

## 2020-08-11 ENCOUNTER — Ambulatory Visit
Admission: RE | Admit: 2020-08-11 | Discharge: 2020-08-11 | Disposition: A | Discharge status: Home / Self Care | Payer: Self-pay | Source: Ambulatory Visit | Attending: Obstetrics and Gynecology | Admitting: Obstetrics and Gynecology

## 2020-08-11 ENCOUNTER — Other Ambulatory Visit: Payer: Self-pay

## 2020-08-11 DIAGNOSIS — N921 Excessive and frequent menstruation with irregular cycle: Secondary | ICD-10-CM

## 2020-08-11 DIAGNOSIS — N83202 Unspecified ovarian cyst, left side: Secondary | ICD-10-CM

## 2020-08-15 ENCOUNTER — Ambulatory Visit: Payer: Self-pay | Admitting: Obstetrics and Gynecology

## 2020-09-28 ENCOUNTER — Encounter: Payer: Self-pay | Admitting: Internal Medicine

## 2020-09-28 ENCOUNTER — Telehealth: Payer: Self-pay | Admitting: Internal Medicine

## 2020-09-28 ENCOUNTER — Inpatient Hospital Stay: Payer: Self-pay | Attending: Internal Medicine | Admitting: Internal Medicine

## 2020-09-28 ENCOUNTER — Inpatient Hospital Stay: Payer: Self-pay

## 2020-09-28 DIAGNOSIS — N939 Abnormal uterine and vaginal bleeding, unspecified: Secondary | ICD-10-CM | POA: Insufficient documentation

## 2020-09-28 DIAGNOSIS — D509 Iron deficiency anemia, unspecified: Secondary | ICD-10-CM

## 2020-09-28 LAB — CBC WITH DIFFERENTIAL/PLATELET
Abs Immature Granulocytes: 0.01 10*3/uL (ref 0.00–0.07)
Basophils Absolute: 0.1 10*3/uL (ref 0.0–0.1)
Basophils Relative: 1 %
Eosinophils Absolute: 0.1 10*3/uL (ref 0.0–0.5)
Eosinophils Relative: 2 %
HCT: 32.2 % — ABNORMAL LOW (ref 36.0–46.0)
Hemoglobin: 9.3 g/dL — ABNORMAL LOW (ref 12.0–15.0)
Immature Granulocytes: 0 %
Lymphocytes Relative: 32 %
Lymphs Abs: 1.6 10*3/uL (ref 0.7–4.0)
MCH: 23.3 pg — ABNORMAL LOW (ref 26.0–34.0)
MCHC: 28.9 g/dL — ABNORMAL LOW (ref 30.0–36.0)
MCV: 80.5 fL (ref 80.0–100.0)
Monocytes Absolute: 0.4 10*3/uL (ref 0.1–1.0)
Monocytes Relative: 8 %
Neutro Abs: 2.8 10*3/uL (ref 1.7–7.7)
Neutrophils Relative %: 57 %
Platelets: 249 10*3/uL (ref 150–400)
RBC: 4 MIL/uL (ref 3.87–5.11)
RDW: 21.3 % — ABNORMAL HIGH (ref 11.5–15.5)
WBC: 5 10*3/uL (ref 4.0–10.5)
nRBC: 0 % (ref 0.0–0.2)

## 2020-09-28 LAB — IRON AND TIBC
Iron: 92 ug/dL (ref 28–170)
Saturation Ratios: 19 % (ref 10.4–31.8)
TIBC: 480 ug/dL — ABNORMAL HIGH (ref 250–450)
UIBC: 388 ug/dL

## 2020-09-28 LAB — FERRITIN: Ferritin: 7 ng/mL — ABNORMAL LOW (ref 11–307)

## 2020-09-28 LAB — LACTATE DEHYDROGENASE: LDH: 115 U/L (ref 98–192)

## 2020-09-28 NOTE — Assessment & Plan Note (Addendum)
#   Anemia-severe  [August 2022-hemoglobin 4.6]-suspect iron deficiency.  Currently on oral iron/also dietary supplementation.  #Etiology: Likely menorrhagia.  Recommend follow-up with Dr. Laural Golden gynecology [facility] pelvis-atrium HP-benign right ovarian cyst].  Also recommend evaluation with GI to rule out any GI causes.  Patient had screening colonoscopy.  Patient declines GI evaluation/ patient reluctant.  Defer to PCP.  Thank you, Mayford Knife for allowing me to participate in the care of your pleasant patient. Please do not hesitate to contact me with questions or concerns in the interim.  # DISPOSITION:  # labs today-cbc/LDH;  iron studies/ferritin- # follow up TBD-Dr.B  Addendum: Left a voicemail for the patient; also sent a MyChart message-hemoglobin improved at 9.3; continue current iron supplementation/dietary supplementation.  Recommend follow-up in 4 months-MD; labs CBC/BMP iron studies ferritin- dr.B

## 2020-09-28 NOTE — Addendum Note (Signed)
Addended by: Keitha Butte on: 09/28/2020 03:36 PM   Modules accepted: Orders

## 2020-09-28 NOTE — Progress Notes (Signed)
[name] CONSULT NOTE  Patient Care Team: Philip Aspen, MD as PCP - General (Internal Medicine)  CHIEF COMPLAINTS/PURPOSE OF CONSULTATION: ANEMIA  HEMATOLOGY HISTORY:  # MICROCYTIC ANEMIA [2017 hemoglobin normal AUG 2022- nadir Hb 4.8; N- WBC/platelets; renal function/LFTs; increased reticulocyte count; iron saturation 7%; ferritin normal-s/p PRBC transfusion; ] - ? Heavy menstrual cycles- Dr.Shuman Shelva Majestic side- ? mirena] EGD-; colonoscopy- ; capsule-  #  HISTORY OF PRESENTING ILLNESS:  Susan Hall 50 y.o.  female has been referred to [place] for further evaluation/work-up treatment for anemia.  Patient was recently admitted to hospital in the [facility] for hemoglobin of 4.6.  Patient did not receive any GI evaluation.  Patient s/p PRBC transfusion.  Patient was recently evaluated PCP -referred to [place] for further evaluation and recommendations.    Patient is currently on oral supplementation of iron/ Barimelts; also dietary supplementation.  Patient does endorse a history of heavy menstrual bleeding-especially month of June thru august.   Blood in stools: None Change in bowel habits- None Blood in urine: None Difficulty swallowing: None Abnormal weight loss: None Iron supplementation: recently in 2 weeks Prior Blood transfusions: aug 2022 Bariatric surgery: None EGD/Colonoscopy: none  Vaginal bleeding: heavy menstrual cycles  Review of Systems  Constitutional:  Positive for malaise/fatigue. Negative for chills, diaphoresis, fever and weight loss.  HENT:  Negative for nosebleeds and sore throat.   Eyes:  Negative for double vision.  Respiratory:  Negative for cough, hemoptysis, sputum production, shortness of breath and wheezing.   Cardiovascular:  Negative for chest pain, palpitations, orthopnea and leg swelling.  Gastrointestinal:  Negative for abdominal pain, blood in stool, constipation, diarrhea, heartburn, melena, nausea and vomiting.   Genitourinary:  Negative for dysuria, frequency and urgency.  Musculoskeletal:  Negative for back pain and joint pain.  Skin: Negative.  Negative for itching and rash.  Neurological:  Negative for dizziness, tingling, focal weakness, weakness and headaches.  Endo/Heme/Allergies:  Does not bruise/bleed easily.  Psychiatric/Behavioral:  Negative for depression. The patient is not nervous/anxious and does not have insomnia.    MEDICAL HISTORY:  Past Medical History:  Diagnosis Date   Anxiety    Depression     SURGICAL HISTORY: Past Surgical History:  Procedure Laterality Date   NO PAST SURGERIES      SOCIAL HISTORY: Social History   Socioeconomic History   Marital status: Married    Spouse name: Not on file   Number of children: Not on file   Years of education: Not on file   Highest education level: Not on file  Occupational History   Not on file  Tobacco Use   Smoking status: Never   Smokeless tobacco: Never  Vaping Use   Vaping Use: Never used  Substance and Sexual Activity   Alcohol use: Not Currently    Comment: occasional   Drug use: No   Sexual activity: Yes    Comment: husband has vasectomy  Other Topics Concern   Not on file  Social History Narrative   [place]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  2 sons- no smoking; rare alcohol.    Social Determinants of Health   Financial Resource Strain: Not on file  Food Insecurity: Not on file  Transportation Needs: Not on file  Physical Activity: Not on file  Stress: Not on file  Social Connections: Not on file  Intimate Partner Violence: Not on file    FAMILY HISTORY: Family History  Problem Relation Age  of Onset   Mental illness Mother        depression   Depression Mother    Hypertension Mother    Heart attack Father 42   Heart disease Maternal Grandmother    Hypertension Maternal Grandmother    Hyperlipidemia Maternal Grandmother    Heart attack Maternal Grandfather     Arthritis Paternal Grandmother    Alcohol abuse Paternal Grandfather     ALLERGIES:  is allergic to doxycycline, erythromycin, and penicillins.  MEDICATIONS:  Current Outpatient Medications  Medication Sig Dispense Refill   OVER THE COUNTER MEDICATION Take 18 mg by mouth 3 (three) times daily. Iron 18 mg with vitamin C three times a day     No current facility-administered medications for this visit.      PHYSICAL EXAMINATION:   Vitals:   09/28/20 1209  BP: 111/63  Pulse: 63  Resp: 16  Temp: 98.4 F (36.9 C)  SpO2: 99%   Filed Weights   09/28/20 1209  Weight: 136 lb (61.7 kg)    Physical Exam Vitals and nursing note reviewed.  HENT:     Head: Normocephalic and atraumatic.     Mouth/Throat:     Pharynx: Oropharynx is clear.  Eyes:     Extraocular Movements: Extraocular movements intact.     Pupils: Pupils are equal, round, and reactive to light.  Cardiovascular:     Rate and Rhythm: Normal rate and regular rhythm.  Pulmonary:     Comments: Decreased breath sounds bilaterally.  Abdominal:     Palpations: Abdomen is soft.  Musculoskeletal:        General: Normal range of motion.     Cervical back: Normal range of motion.  Skin:    General: Skin is warm.  Neurological:     General: No focal deficit present.     Mental Status: She is alert and oriented to person, place, and time.  Psychiatric:        Behavior: Behavior normal.        Judgment: Judgment normal.    LABORATORY DATA:  I have reviewed the data as listed Lab Results  Component Value Date   WBC 5.0 09/28/2020   HGB 9.3 (L) 09/28/2020   HCT 32.2 (L) 09/28/2020   MCV 80.5 09/28/2020   PLT 249 09/28/2020   No results for input(s): NA, K, CL, CO2, GLUCOSE, BUN, CREATININE, CALCIUM, GFRNONAA, GFRAA, PROT, ALBUMIN, AST, ALT, ALKPHOS, BILITOT, BILIDIR, IBILI in the last 8760 hours.   No results found.  Microcytic anemia # Anemia-severe  [August 2022-hemoglobin 4.6]-suspect iron deficiency.   Currently on oral iron/also dietary supplementation.  #Etiology: Likely menorrhagia.  Recommend follow-up with Dr. Laural Golden gynecology [facility] pelvis-atrium HP-benign right ovarian cyst].  Also recommend evaluation with GI to rule out any GI causes.  Patient had screening colonoscopy.  Patient declines GI evaluation/ patient reluctant.  Defer to PCP.  Thank you, Mayford Knife for allowing me to participate in the care of your pleasant patient. Please do not hesitate to contact me with questions or concerns in the interim.  # DISPOSITION:  # labs today-cbc/LDH;  iron studies/ferritin- # follow up TBD-Dr.B  Addendum: Left a voicemail for the patient; also sent a MyChart message-hemoglobin improved at 9.3; continue current iron supplementation/dietary supplementation.  Recommend follow-up in 4 months-MD; labs CBC/BMP iron studies ferritin- dr.B   All questions were answered. The patient knows to call the clinic with any problems, questions or concerns.    Earna Coder, MD 09/28/2020 7:17 PM

## 2020-09-28 NOTE — Telephone Encounter (Signed)
Addendum: Left a voicemail for the patient; also sent a MyChart message-hemoglobin improved at 9.3; continue current iron supplementation/dietary supplementation.  Schedule- follow-up in 4 months-MD; labs CBC/BMP iron studies ferritin- dr.B

## 2020-09-28 NOTE — Progress Notes (Signed)
Pt reports referred by Dr Mayford Knife for anemia.  Pt states she has been more tired and fatigues easily.

## 2021-01-24 ENCOUNTER — Other Ambulatory Visit: Payer: Self-pay | Admitting: *Deleted

## 2021-01-24 DIAGNOSIS — D509 Iron deficiency anemia, unspecified: Secondary | ICD-10-CM

## 2021-01-29 ENCOUNTER — Inpatient Hospital Stay: Payer: Self-pay | Admitting: Internal Medicine

## 2021-01-29 ENCOUNTER — Inpatient Hospital Stay: Payer: Self-pay

## 2021-02-11 ENCOUNTER — Other Ambulatory Visit: Payer: Self-pay

## 2021-02-11 ENCOUNTER — Emergency Department
Admission: EM | Admit: 2021-02-11 | Discharge: 2021-02-11 | Disposition: A | Discharge status: Home / Self Care | Payer: Self-pay | Attending: Emergency Medicine | Admitting: Emergency Medicine

## 2021-02-11 ENCOUNTER — Emergency Department: Payer: Self-pay

## 2021-02-11 ENCOUNTER — Encounter: Payer: Self-pay | Admitting: Emergency Medicine

## 2021-02-11 DIAGNOSIS — N939 Abnormal uterine and vaginal bleeding, unspecified: Secondary | ICD-10-CM | POA: Insufficient documentation

## 2021-02-11 DIAGNOSIS — D649 Anemia, unspecified: Secondary | ICD-10-CM | POA: Insufficient documentation

## 2021-02-11 LAB — BASIC METABOLIC PANEL
Anion gap: 5 (ref 5–15)
BUN: 10 mg/dL (ref 6–20)
CO2: 24 mmol/L (ref 22–32)
Calcium: 9 mg/dL (ref 8.9–10.3)
Chloride: 107 mmol/L (ref 98–111)
Creatinine, Ser: 0.62 mg/dL (ref 0.44–1.00)
GFR, Estimated: >60 mL/min (ref >60)
Glucose, Bld: 97 mg/dL (ref 70–99)
Potassium: 4 mmol/L (ref 3.5–5.1)
Sodium: 136 mmol/L (ref 135–145)

## 2021-02-11 LAB — ABO/RH: ABO/RH(D): B POS

## 2021-02-11 LAB — HCG, QUANTITATIVE, PREGNANCY: hCG, Beta Chain, Quant, S: <1 m[IU]/mL (ref <5)

## 2021-02-11 LAB — CBC
HCT: 19.5 % — ABNORMAL LOW (ref 36.0–46.0)
Hemoglobin: 5 g/dL — ABNORMAL LOW (ref 12.0–15.0)
MCH: 18.7 pg — ABNORMAL LOW (ref 26.0–34.0)
MCHC: 25.6 g/dL — ABNORMAL LOW (ref 30.0–36.0)
MCV: 72.8 fL — ABNORMAL LOW (ref 80.0–100.0)
Platelets: 255 10*3/uL (ref 150–400)
RBC: 2.68 MIL/uL — ABNORMAL LOW (ref 3.87–5.11)
RDW: 18.4 % — ABNORMAL HIGH (ref 11.5–15.5)
WBC: 4.3 10*3/uL (ref 4.0–10.5)
nRBC: 0 % (ref 0.0–0.2)

## 2021-02-11 LAB — TROPONIN I (HIGH SENSITIVITY): Troponin I (High Sensitivity): <2 ng/L (ref <18)

## 2021-02-11 MED ORDER — MEDROXYPROGESTERONE ACETATE 10 MG PO TABS: 20.0000 mg | ORAL_TABLET | Freq: Three times a day (TID) | ORAL | 0 refills | Status: DC

## 2021-02-11 MED ORDER — SODIUM CHLORIDE 0.9 % IV SOLN
10.0000 mL/h | Freq: Once | INTRAVENOUS | Status: AC
  Administered 2021-02-11: 10 mL/h via INTRAVENOUS

## 2021-02-11 NOTE — ED Triage Notes (Signed)
Pt via POV from home. Pt has a hx of heavy periods and needed a blood transfusion before. Pt states that the past 2 days she has been increasingly SOB and fatigue. Pt states she feels like her heart fluttering. Pt states for the past 2 days she has been going through approx 2-3 pads every hours. Pt is A&OX4 and NAD.

## 2021-02-11 NOTE — ED Provider Notes (Signed)
[facility] Provider Note    Event Date/Time   First MD Initiated Contact with Patient 02/11/21 1519     (approximate)   History   Shortness of Breath and Fatigue   HPI  Susan Hall is a 51 y.o. female with a history of abnormal uterine bleeding with a history of requiring a transfusion in the past, being followed and evaluated by Dr. Nechama Guard from [facility] gynecology, comes the ED complaining of worsening shortness of breath, fatigue, coldness and some lightheadedness that is been going on for the past 24 hours.  This is in the setting of having vaginal bleeding over the last 3 weeks.  Most of this time that it is described as spotting, but there is been about a week of heavy bleeding, and the last 2 days have been severe, soaking through 2-3 pads per hour.  She does not take blood thinners or any other pharmaceutical medications.  She has been attempting to optimize her nutrition with the help of a nutritionist and dietary supplements.  Reviewing electronic medical record, her evaluation at [facility] gynecology has shown a benign appearing ovarian cyst, endometrial biopsy which was normal.  Management plan for her AUB includes IUD, D&C, or Provera.       Physical Exam   Triage Vital Signs: ED Triage Vitals [02/11/21 1344]  Enc Vitals Group     BP (!) 125/51     Pulse Rate 79     Resp 20     Temp 98.4 F (36.9 C)     Temp Source Oral     SpO2 100 %     Weight 137 lb (62.1 kg)     Height 5\' 5"  (1.651 m)     Head Circumference      Peak Flow      Pain Score 0     Pain Loc      Pain Edu?      Excl. in [place]?     Most recent vital signs: Vitals:   02/11/21 1344  BP: (!) 125/51  Pulse: 79  Resp: 20  Temp: 98.4 F (36.9 C)  SpO2: 100%     General: Awake, no distress.  There is general pallor  CV:  Good peripheral perfusion.  Regular rate and rhythm Resp:  Normal effort.  Clear to auscultation bilaterally Abd:  No distention.  Soft and  nontender Other:  Conjunctiva are pale   ED Results / Procedures / Treatments   Labs (all labs ordered are listed, but only abnormal results are displayed) Labs Reviewed  CBC - Abnormal; Notable for the following components:      Result Value   RBC 2.68 (*)    Hemoglobin 5.0 (*)    HCT 19.5 (*)    MCV 72.8 (*)    MCH 18.7 (*)    MCHC 25.6 (*)    RDW 18.4 (*)    All other components within normal limits  BASIC METABOLIC PANEL  HCG, QUANTITATIVE, PREGNANCY  POC URINE PREG, ED  TYPE AND SCREEN  PREPARE RBC (CROSSMATCH)  TROPONIN I (HIGH SENSITIVITY)     EKG  Interpreted by me Normal sinus rhythm rate of 85.  Normal axis and intervals.  Normal QRS ST segments and T waves.  No ischemic changes.   RADIOLOGY Chest x-ray viewed and interpreted by me, appears unremarkable.  Clear lungs, no pneumothorax.  Radiology report reviewed    PROCEDURES:  Critical Care performed: Yes, see critical care procedure note(s)  .  Critical Care Performed by: Carrie Mew, MD Authorized by: Carrie Mew, MD   Critical care provider statement:    Critical care time (minutes):  32   Critical care time was exclusive of:  Separately billable procedures and treating other patients   Critical care was necessary to treat or prevent imminent or life-threatening deterioration of the following conditions:  Sepsis, respiratory failure and circulatory failure   Critical care was time spent personally by me on the following activities:  Development of treatment plan with patient or surrogate, discussions with consultants, evaluation of patient's response to treatment, examination of patient, obtaining history from patient or surrogate, ordering and performing treatments and interventions, ordering and review of laboratory studies, ordering and review of radiographic studies, pulse oximetry, re-evaluation of patient's condition and review of old charts   MEDICATIONS ORDERED IN ED: Medications   0.9 %  sodium chloride infusion (has no administration in time range)     IMPRESSION / MDM / ASSESSMENT AND PLAN / ED COURSE  I reviewed the triage vital signs and the nursing notes.                              Differential diagnosis includes, but is not limited to, anemia, electrolyte abnormality, renal failure, pneumonia     Patient presents with shortness of breath fatigue and coldness.  Labs show hemoglobin of 5, due to severe persistent abnormal uterine bleeding.  She is nontoxic, blood pressure is normal, not in shock.  Doubt hypothyroidism or infection.  Discussed treatment strategies with the patient.  She agrees to red blood cell transfusion.  She is not amenable to considering D&C right now, and she would prefer to do a course of Provera and follow-up in clinic with gynecology this week.  Discussed this plan with Dr. Kenton Kingfisher or [facility] gynecology and he agrees..  Clinical Course as of 02/11/21 2042  Nancy Fetter Feb 11, 2021  2041 Patient is feeling much better.  No shortness of breath.  Able to ambulate asymptomatically.  Reports that she is not had any bleeding since arrival to the emergency department.  I will provide a prescription for Provera that she can fill only if severe bleeding returns tomorrow.  She will follow-up with [facility] gynecology. [PS]    Clinical Course User Index [PS] Carrie Mew, MD     FINAL CLINICAL IMPRESSION(S) / ED DIAGNOSES   Final diagnoses:  Symptomatic anemia  Abnormal uterine bleeding (AUB)     Rx / DC Orders   ED Discharge Orders     None        Note:  This document was prepared using Dragon voice recognition software and may include unintentional dictation errors.   Carrie Mew, MD 02/11/21 [phone]

## 2021-02-11 NOTE — ED Notes (Signed)
Pt tolerated transfusions well. Denies any pain, SOB, fever/chills, anxiety, etc. Per EDP do not have to recheck H&H at this time after discussion with pt and husband.

## 2021-02-11 NOTE — ED Notes (Signed)
Transfusion discussed with Dr. Scotty Court, ok to run as fast as pt will tolerate.  Pt has no known heart or kidney dysfunction.

## 2021-02-12 LAB — TYPE AND SCREEN
ABO/RH(D): B POS
Antibody Screen: NEGATIVE
Unit division: 0
Unit division: 0

## 2021-02-12 LAB — BPAM RBC
Blood Product Expiration Date: 202302222359
Blood Product Expiration Date: 202302232359
ISSUE DATE / TIME: 202301291720
ISSUE DATE / TIME: 202301291847
Unit Type and Rh: 7300
Unit Type and Rh: 7300

## 2021-02-13 ENCOUNTER — Encounter: Payer: Self-pay | Admitting: Obstetrics and Gynecology

## 2021-02-13 ENCOUNTER — Other Ambulatory Visit: Payer: Self-pay

## 2021-02-13 ENCOUNTER — Ambulatory Visit (INDEPENDENT_AMBULATORY_CARE_PROVIDER_SITE_OTHER): Payer: Self-pay | Admitting: Obstetrics and Gynecology

## 2021-02-13 VITALS — BP 118/70 | Ht 65.0 in | Wt 144.2 lb

## 2021-02-13 DIAGNOSIS — N939 Abnormal uterine and vaginal bleeding, unspecified: Secondary | ICD-10-CM

## 2021-02-13 MED ORDER — MEDROXYPROGESTERONE ACETATE 10 MG PO TABS: ORAL_TABLET | ORAL | 0 refills | Status: DC

## 2021-02-13 NOTE — Progress Notes (Signed)
Patient ID: Susan Hall, female   DOB: October 30, 1970, 51 y.o.   MRN: [id number]  Reason for Consult: Gynecologic Exam   Referred by Gerilyn Nestle, MD  Subjective:     HPI:  Susan KAWCZYNSKI is a 51 y.o. female.  She is following up regarding abnormal uterine bleeding.  I previously saw the patient in April 2022.  She reports that since I last saw her she is continued to have on and off bouts of heavy menstrual bleeding.  She was seen in the emergency room in August at [facility] and received 3 units of blood.  She had a pelvic ultrasound at that time which showed endometrial thickness of 10 mm.  Her left ovary was not visualized.  Her right ovary was normal.  She reports that between the time of August and December she did not have any vaginal bleeding.  Her vaginal bleeding returned in December.  She has had fluctuating days of heavy and light bleeding.  Sometimes the bleeding will be worse at night.  She has been having bleeding consistently for at least 6 weeks.  She was seen earlier this week through the emergency department.  Her hemoglobin was found to be 5.0.  She was transfused 2 units.  She reports that she has continued to have bleeding since she left the emergency room.  She started Provera for this bleeding last night.  She has taken 2 doses of the medication.  She reports that she is not interested in a Mirena IUD which has been suggested to her in the past for heavy bleeding.  She reports that she has had discussions with her mother and grandmother.  She reports that her grandmother had success with a hysteroscopy D&C and not having bleeding afterwards and she would like to consider that.  She reports that her mother had heavy bleeding from endometriosis.  Gynecological History  Patient's last menstrual period was 02/09/2021.  Past Medical History:  Diagnosis Date   Anxiety    Depression    Family History  Problem Relation Age of Onset   Mental illness Mother        depression    Depression Mother    Hypertension Mother    Heart attack Father 59   Heart disease Maternal Grandmother    Hypertension Maternal Grandmother    Hyperlipidemia Maternal Grandmother    Heart attack Maternal Grandfather    Arthritis Paternal Grandmother    Alcohol abuse Paternal Grandfather    Past Surgical History:  Procedure Laterality Date   NO PAST SURGERIES      Short Social History:  Social History   Tobacco Use   Smoking status: Never   Smokeless tobacco: Never  Substance Use Topics   Alcohol use: Not Currently    Comment: occasional    Allergies  Allergen Reactions   Doxycycline Hives   Erythromycin Hives   Penicillins Hives    Current Outpatient Medications  Medication Sig Dispense Refill   medroxyPROGESTERone (PROVERA) 10 MG tablet Take 2 tablets (20 mg total) by mouth in the morning, at noon, and at bedtime for 7 days. 42 tablet 0   medroxyPROGESTERone (PROVERA) 10 MG tablet Take 1 tablet (10 mg total) by mouth 3 (three) times daily for 5 days, THEN 1 tablet (10 mg total) 2 (two) times daily for 5 days, THEN 1 tablet (10 mg total) daily for 20 days. 45 tablet 0   OVER THE COUNTER MEDICATION Take 18 mg by mouth 3 (three)  times daily. Iron 18 mg with vitamin C three times a day     No current facility-administered medications for this visit.    Review of Systems  Constitutional: Negative for chills, fatigue, fever and unexpected weight change.  HENT: Negative for trouble swallowing.  Eyes: Negative for loss of vision.  Respiratory: Negative for cough, shortness of breath and wheezing.  Cardiovascular: Negative for chest pain, leg swelling, palpitations and syncope.  GI: Negative for abdominal pain, blood in stool, diarrhea, nausea and vomiting.  GU: Negative for difficulty urinating, dysuria, frequency and hematuria.  Musculoskeletal: Negative for back pain, leg pain and joint pain.  Skin: Negative for rash.  Neurological: Negative for dizziness, headaches,  light-headedness, numbness and seizures.  Psychiatric: Negative for behavioral problem, confusion, depressed mood and sleep disturbance.       Objective:  Objective   Vitals:   02/13/21 1505  BP: 118/70  Weight: 144 lb 3.2 oz (65.4 kg)  Height: 5\' 5"  (1.651 m)   Body mass index is 24 kg/m.  Physical Exam Vitals and nursing note reviewed. Exam conducted with a chaperone present.  Constitutional:      Appearance: Normal appearance. She is well-developed.  HENT:     Head: Normocephalic and atraumatic.  Eyes:     Extraocular Movements: Extraocular movements intact.     Pupils: Pupils are equal, round, and reactive to light.  Cardiovascular:     Rate and Rhythm: Normal rate and regular rhythm.  Pulmonary:     Effort: Pulmonary effort is normal. No respiratory distress.     Breath sounds: Normal breath sounds.  Abdominal:     General: Abdomen is flat.     Palpations: Abdomen is soft.  Genitourinary:    Comments: External: Normal appearing vulva. No lesions noted.  Speculum examination: Normal appearing cervix, clot at the cervical os. 5 cc, moderate blood in the vaginal vault. no discharge.  Bimanual examination: Uterus midline, non-tender, normal in size, shape and contour.  No CMT. No adnexal masses. No adnexal tenderness. Pelvis not fixed.  Breast exam: exam not performed Musculoskeletal:        General: No signs of injury.  Skin:    General: Skin is warm and dry.     Coloration: Skin is jaundiced and pale.  Neurological:     Mental Status: She is alert and oriented to person, place, and time.  Psychiatric:        Behavior: Behavior normal.        Thought Content: Thought content normal.        Judgment: Judgment normal.    Assessment/Plan:     51 yo with abnormal uterine bleeding and secondary chronic blood loss anemia.  Moderate blood and clot at cervical os today Just started provera last night. Continue with provera taper. Come to the ER if heavy bleeding does  not improve- would consider IV estrogen.  Ordered a pelvic US.  Reviewed options for medical and surgical management of abnormal uterine bleeding. She is interested in hysteroscopy D&C with ablation. Note sent to surgical scheduler. Declines Mirena, she is concerned about hormonal effect of this device.   More than 20 minutes were spent face to face with the patient in the room, reviewing the medical record, labs and images, and coordinating care for the patient. The plan of management was discussed in detail and counseling was provided.    Adrian Prows MD, [facility], [facility] 02/13/2021 3:49 PM

## 2021-02-13 NOTE — Patient Instructions (Signed)
Endometrial Ablation Endometrial ablation is a procedure that destroys the thin inner layer of the lining of the uterus (endometrium). This procedure may be done: To stop heavy menstrual periods. To stop bleeding that is causing anemia. To control irregular bleeding. To treat bleeding caused by small tumors (fibroids) in the endometrium. This procedure is often done as an alternative to major surgery, such as removal of the uterus and cervix (hysterectomy). As a result of this procedure: You may not be able to have children. However, if you have not yet gone through menopause: You may still have a small chance of getting pregnant. You will need to use a reliable method of birth control after the procedure to prevent pregnancy. You may stop having a menstrual period, or you may have only a small amount of bleeding during your period. Menstruation may return several years after the procedure. Tell a health care provider about: Any allergies you have. All medicines you are taking, including vitamins, herbs, eye drops, creams, and over-the-counter medicines. Any problems you or family members have had with the use of anesthetic medicines. Any blood disorders you have. Any surgeries you have had. Any medical conditions you have. Whether you are pregnant or may be pregnant. What are the risks? Generally, this is a safe procedure. However, problems may occur, including: A hole (perforation) in the uterus or bowel. Infection in the uterus, bladder, or vagina. Bleeding. Allergic reaction to medicines. Damage to nearby structures or organs. An air bubble in the lung (air embolus). Problems with pregnancy. Failure of the procedure. Decreased ability to diagnose cancer in the endometrium. Scar tissue forms after the procedure, making it more difficult to get a sample of the uterine lining. What happens before the procedure? Medicines Ask your health care provider about: Changing or stopping your  regular medicines. This is especially important if you take diabetes medicines or blood thinners. Taking medicines such as aspirin and ibuprofen. These medicines can thin your blood. Do not take these medicines before your procedure if your doctor tells you not to take them. Taking over-the-counter medicines, vitamins, herbs, and supplements. Tests You will have tests of your endometrium to make sure there are no precancerous cells or cancer cells present. You may have an ultrasound of the uterus. General instructions Do not use any products that contain nicotine or tobacco for at least 4 weeks before the procedure. These include cigarettes, chewing tobacco, and vaping devices, such as e-cigarettes. If you need help quitting, ask your health care provider. You may be given medicines to thin the endometrium. Ask your health care provider what steps will be taken to help prevent infection. These steps may include: Removing hair at the surgery site. Washing skin with a germ-killing soap. Taking antibiotic medicine. Plan to have a responsible adult take you home from the hospital or clinic. Plan to have a responsible adult care for you for the time you are told after you leave the hospital or clinic. This is important. What happens during the procedure?  You will lie on an exam table with your feet and legs supported as in a pelvic exam. An IV will be inserted into one of your veins. You will be given a medicine to help you relax (sedative). A surgical tool with a light and camera (resectoscope) will be inserted into your vagina and moved into your uterus. This allows your surgeon to see inside your uterus. Endometrial tissue will be destroyed and removed, using one of the following methods: Radiofrequency. This   uses an electrical current to destroy the endometrium. Cryotherapy. This uses extreme cold to freeze the endometrium. Heated fluid. This uses a heated salt and water (saline) solution to  destroy the endometrium. Microwave. This uses high-energy microwaves to heat up the endometrium and destroy it. Thermal balloon. This involves inserting a catheter with a balloon tip into the uterus. The balloon tip is filled with heated fluid to destroy the endometrium. The procedure may vary among health care providers and hospitals. What happens after the procedure? Your blood pressure, heart rate, breathing rate, and blood oxygen level will be monitored until you leave the hospital or clinic. You may have vaginal bleeding for 4-6 weeks after the procedure. You may also have: Cramps. A thin, watery vaginal discharge that is light pink or brown. A need to urinate more than usual. Nausea. If you were given a sedative during the procedure, it can affect you for several hours. Do not drive or operate machinery until your health care provider says that it is safe. Do not have sex or insert anything into your vagina until your health care provider says it is safe. Summary Endometrial ablation is done to treat many causes of heavy menstrual bleeding. The procedure destroys the thin inner layer of the lining of the uterus (endometrium). This procedure is often done as an alternative to major surgery, such as removal of the uterus and cervix (hysterectomy). Plan to have a responsible adult take you home from the hospital or clinic. This information is not intended to replace advice given to you by your health care provider. Make sure you discuss any questions you have with your health care provider. Document Revised: 07/22/2019 Document Reviewed: 07/22/2019 Elsevier Patient Education  2022 [employer]  

## 2021-02-13 NOTE — H&P (View-Only) (Signed)
Patient ID: Susan Hall, female   DOB: [birth year], 51 y.o.   MRN: [id number]  Reason for Consult: Gynecologic Exam   Referred by Gerilyn Nestle, MD  Subjective:     HPI:  TAYLRE RAISON is a 51 y.o. female.  She is following up regarding abnormal uterine bleeding.  I previously saw the patient in April 2022.  She reports that since I last saw her she is continued to have on and off bouts of heavy menstrual bleeding.  She was seen in the emergency room in August at [facility] and received 3 units of blood.  She had a pelvic ultrasound at that time which showed endometrial thickness of 10 mm.  Her left ovary was not visualized.  Her right ovary was normal.  She reports that between the time of August and December she did not have any vaginal bleeding.  Her vaginal bleeding returned in December.  She has had fluctuating days of heavy and light bleeding.  Sometimes the bleeding will be worse at night.  She has been having bleeding consistently for at least 6 weeks.  She was seen earlier this week through the emergency department.  Her hemoglobin was found to be 5.0.  She was transfused 2 units.  She reports that she has continued to have bleeding since she left the emergency room.  She started Provera for this bleeding last night.  She has taken 2 doses of the medication.  She reports that she is not interested in a Mirena IUD which has been suggested to her in the past for heavy bleeding.  She reports that she has had discussions with her mother and grandmother.  She reports that her grandmother had success with a hysteroscopy D&C and not having bleeding afterwards and she would like to consider that.  She reports that her mother had heavy bleeding from endometriosis.  Gynecological History  Patient's last menstrual period was 02/09/2021.  Past Medical History:  Diagnosis Date   Anxiety    Depression    Family History  Problem Relation Age of Onset   Mental illness Mother        depression    Depression Mother    Hypertension Mother    Heart attack Father 56   Heart disease Maternal Grandmother    Hypertension Maternal Grandmother    Hyperlipidemia Maternal Grandmother    Heart attack Maternal Grandfather    Arthritis Paternal Grandmother    Alcohol abuse Paternal Grandfather    Past Surgical History:  Procedure Laterality Date   NO PAST SURGERIES      Short Social History:  Social History   Tobacco Use   Smoking status: Never   Smokeless tobacco: Never  Substance Use Topics   Alcohol use: Not Currently    Comment: occasional    Allergies  Allergen Reactions   Doxycycline Hives   Erythromycin Hives   Penicillins Hives    Current Outpatient Medications  Medication Sig Dispense Refill   medroxyPROGESTERone (PROVERA) 10 MG tablet Take 2 tablets (20 mg total) by mouth in the morning, at noon, and at bedtime for 7 days. 42 tablet 0   medroxyPROGESTERone (PROVERA) 10 MG tablet Take 1 tablet (10 mg total) by mouth 3 (three) times daily for 5 days, THEN 1 tablet (10 mg total) 2 (two) times daily for 5 days, THEN 1 tablet (10 mg total) daily for 20 days. 45 tablet 0   OVER THE COUNTER MEDICATION Take 18 mg by mouth 3 (three)  times daily. Iron 18 mg with vitamin C three times a day     No current facility-administered medications for this visit.    Review of Systems  Constitutional: Negative for chills, fatigue, fever and unexpected weight change.  HENT: Negative for trouble swallowing.  Eyes: Negative for loss of vision.  Respiratory: Negative for cough, shortness of breath and wheezing.  Cardiovascular: Negative for chest pain, leg swelling, palpitations and syncope.  GI: Negative for abdominal pain, blood in stool, diarrhea, nausea and vomiting.  GU: Negative for difficulty urinating, dysuria, frequency and hematuria.  Musculoskeletal: Negative for back pain, leg pain and joint pain.  Skin: Negative for rash.  Neurological: Negative for dizziness, headaches,  light-headedness, numbness and seizures.  Psychiatric: Negative for behavioral problem, confusion, depressed mood and sleep disturbance.       Objective:  Objective   Vitals:   02/13/21 1505  BP: 118/70  Weight: 144 lb 3.2 oz (65.4 kg)  Height: 5\' 5"  (1.651 m)   Body mass index is 24 kg/m.  Physical Exam Vitals and nursing note reviewed. Exam conducted with a chaperone present.  Constitutional:      Appearance: Normal appearance. She is well-developed.  HENT:     Head: Normocephalic and atraumatic.  Eyes:     Extraocular Movements: Extraocular movements intact.     Pupils: Pupils are equal, round, and reactive to light.  Cardiovascular:     Rate and Rhythm: Normal rate and regular rhythm.  Pulmonary:     Effort: Pulmonary effort is normal. No respiratory distress.     Breath sounds: Normal breath sounds.  Abdominal:     General: Abdomen is flat.     Palpations: Abdomen is soft.  Genitourinary:    Comments: External: Normal appearing vulva. No lesions noted.  Speculum examination: Normal appearing cervix, clot at the cervical os. 5 cc, moderate blood in the vaginal vault. no discharge.  Bimanual examination: Uterus midline, non-tender, normal in size, shape and contour.  No CMT. No adnexal masses. No adnexal tenderness. Pelvis not fixed.  Breast exam: exam not performed Musculoskeletal:        General: No signs of injury.  Skin:    General: Skin is warm and dry.     Coloration: Skin is jaundiced and pale.  Neurological:     Mental Status: She is alert and oriented to person, place, and time.  Psychiatric:        Behavior: Behavior normal.        Thought Content: Thought content normal.        Judgment: Judgment normal.    Assessment/Plan:     51 yo with abnormal uterine bleeding and secondary chronic blood loss anemia.  Moderate blood and clot at cervical os today Just started provera last night. Continue with provera taper. Come to the ER if heavy bleeding does  not improve- would consider IV estrogen.  Ordered a pelvic US.  Reviewed options for medical and surgical management of abnormal uterine bleeding. She is interested in hysteroscopy D&C with ablation. Note sent to surgical scheduler. Declines Mirena, she is concerned about hormonal effect of this device.   More than 20 minutes were spent face to face with the patient in the room, reviewing the medical record, labs and images, and coordinating care for the patient. The plan of management was discussed in detail and counseling was provided.    Adrian Prows MD, [facility], [facility] 02/13/2021 3:49 PM

## 2021-02-15 LAB — PREPARE RBC (CROSSMATCH)

## 2021-02-21 ENCOUNTER — Telehealth: Payer: Self-pay | Admitting: Obstetrics and Gynecology

## 2021-02-21 ENCOUNTER — Other Ambulatory Visit: Payer: Self-pay

## 2021-02-21 ENCOUNTER — Other Ambulatory Visit: Payer: Self-pay | Admitting: Obstetrics and Gynecology

## 2021-02-21 ENCOUNTER — Ambulatory Visit
Admission: RE | Admit: 2021-02-21 | Discharge: 2021-02-21 | Disposition: A | Discharge status: Home / Self Care | Payer: Self-pay | Source: Ambulatory Visit | Attending: Obstetrics and Gynecology | Admitting: Obstetrics and Gynecology

## 2021-02-21 DIAGNOSIS — N939 Abnormal uterine and vaginal bleeding, unspecified: Secondary | ICD-10-CM

## 2021-02-21 NOTE — Telephone Encounter (Signed)
PT called because she had her U/S on 02-21-21 and would like to know what's her next step. Said she would like to move forward with the ablation and if you could call her back. Thanks

## 2021-02-22 ENCOUNTER — Telehealth: Payer: Self-pay

## 2021-02-22 NOTE — Telephone Encounter (Signed)
Spoke with patient to review her [place] result- she would like to schedule the hytseroscopy D&C with ablation

## 2021-02-22 NOTE — Telephone Encounter (Signed)
-----   Message from Natale Milch, MD sent at 02/13/2021  3:46 PM EST ----- Regarding: [place] and surgery This patient wants to schedule a hysteroscopy D&C and ablation but will need pelvic US first  Surgery Booking Request Patient Full Name:  Susan Hall  MRN: [id number]  DOB: 09-26-1970  Surgeon: Natale Milch, MD  Requested Surgery Date and Time: march Primary Diagnosis AND Code: abnormal uterine bleeding. menorrhagia Secondary Diagnosis and Code:  Surgical Procedure: Hysteroscopy D&C with Ablation RNFA Requested?: No L&D Notification: No Admission Status: same day surgery Length of Surgery: 50 min Special Case Needs: No H&P: No Phone Interview???:  Yes Interpreter: No Medical Clearance:  No Special Scheduling Instructions: Yes- as above Any known health/anesthesia issues, diabetes, sleep apnea, latex allergy, defibrillator/pacemaker?: No Acuity: P3   (P1 highest, P2 delay may cause harm, P3 low, elective gyn, P4 lowest) Post op follow up visits: 1 week post op

## 2021-02-22 NOTE — Telephone Encounter (Signed)
Called patient to schedule Hysteroscopy D&C with Ablation w [facility]  DOS 03/13/21  H&P  n/a  Pre-admit phone call appointment to be requested - date and time will be updated on pt MyChart. Explained that this appointment has a call window. Based on the time scheduled will indicate if the call will be received within a 4 hour window before 1:00 or after.  Advised that pt may also receive calls from the hospital pharmacy and pre-service center.  Confirmed pt is self pay. Estimate done for procedure and sent via chart.

## 2021-02-22 NOTE — Telephone Encounter (Signed)
Left a message for the patient to return the call.  

## 2021-03-02 NOTE — Telephone Encounter (Signed)
Orders placed.

## 2021-03-06 ENCOUNTER — Encounter
Admission: RE | Admit: 2021-03-06 | Discharge: 2021-03-06 | Disposition: A | Discharge status: Home / Self Care | Payer: Self-pay | Source: Ambulatory Visit | Attending: Obstetrics and Gynecology | Admitting: Obstetrics and Gynecology

## 2021-03-06 ENCOUNTER — Encounter: Payer: Self-pay | Admitting: Urgent Care

## 2021-03-06 ENCOUNTER — Other Ambulatory Visit: Payer: Self-pay

## 2021-03-06 DIAGNOSIS — D500 Iron deficiency anemia secondary to blood loss (chronic): Secondary | ICD-10-CM | POA: Insufficient documentation

## 2021-03-06 DIAGNOSIS — N921 Excessive and frequent menstruation with irregular cycle: Secondary | ICD-10-CM | POA: Insufficient documentation

## 2021-03-06 DIAGNOSIS — Z01812 Encounter for preprocedural laboratory examination: Secondary | ICD-10-CM | POA: Insufficient documentation

## 2021-03-06 HISTORY — DX: Anemia, unspecified: D64.9

## 2021-03-06 HISTORY — DX: Bronchitis, not specified as acute or chronic: J40

## 2021-03-06 LAB — CBC
HCT: 26.4 % — ABNORMAL LOW (ref 36.0–46.0)
Hemoglobin: 7.1 g/dL — ABNORMAL LOW (ref 12.0–15.0)
MCH: 19 pg — ABNORMAL LOW (ref 26.0–34.0)
MCHC: 26.9 g/dL — ABNORMAL LOW (ref 30.0–36.0)
MCV: 70.6 fL — ABNORMAL LOW (ref 80.0–100.0)
Platelets: 321 10*3/uL (ref 150–400)
RBC: 3.74 MIL/uL — ABNORMAL LOW (ref 3.87–5.11)
RDW: 19.7 % — ABNORMAL HIGH (ref 11.5–15.5)
WBC: 9.9 10*3/uL (ref 4.0–10.5)
nRBC: 0 % (ref 0.0–0.2)

## 2021-03-06 LAB — TYPE AND SCREEN
ABO/RH(D): B POS
Antibody Screen: NEGATIVE
Extend sample reason: TRANSFUSED

## 2021-03-06 NOTE — Progress Notes (Addendum)
°  [facility] Perioperative Services: Pre-Admission/Anesthesia Testing  Abnormal Lab Notification   Date: 03/06/21  Name: Susan Hall MRN:   [id number]  Re: Abnormal labs noted during PAT appointment   Notified:  Provider Name Provider Role Notification Mode  Natale Milch, MD OB/GYN Routed and/or faxed via [facility]   Clinical Information and Notes:  ABNORMAL LAB VALUE(S):   Lab Results  Component Value Date   HGB 7.1 (L) 03/06/2021   HCT 26.4 (L) 03/06/2021   MCV 70.6 (L) 03/06/2021   MCH 19.0 (L) 03/06/2021    Susan Hall is scheduled for a DILATATION & CURETTAGE/HYSTEROSCOPY WITH MYOSURE on 03/13/2021. Patient significantly anemic secondary to AUB. Symptomatic anemia x 3 weeks (02/11/2021) ago required patient to visit the ED where she was transfused 2 units of PRBCs. Started Provera on 02/12/2021.   Ideally, we would like to see her hemoglobin better optimized prior to proceeding with surgery. Could consider further PRBC transfusion vs intravenous iron infusion.   Order placed to have CBC rechecked on the morning of her procedure to ensure that she is a safe level to proceed with her planned surgical intervention.   Type and screen collected today (03/06/2021) in preparation for potential pre-operative transfusion and to have on hand for the day of her surgery should excessive perioperative bleeding occur.   Quentin Mulling, MSN, APRN, FNP-C, CEN [facility]  Peri-operative Services Nurse Practitioner Phone: [phone] Fax: [phone] 03/06/21 3:38 PM

## 2021-03-06 NOTE — Patient Instructions (Addendum)
Your procedure is scheduled on: Tuesday, February 28 Report to the Registration Desk on the 1st floor of the [facility] To find out your arrival time, please call [phone] between 1PM - 3PM on: Monday, February 27  REMEMBER: Instructions that are not followed completely may result in serious medical risk, up to and including death; or upon the discretion of your surgeon and anesthesiologist your surgery may need to be rescheduled.  Do not eat food after midnight the night before surgery.  No gum chewing, lozengers or hard candies.  You may however, drink CLEAR liquids up to 2 hours before you are scheduled to arrive for your surgery. Do not drink anything within 2 hours of your scheduled arrival time.  Clear liquids include: - water  - apple juice without pulp - gatorade (not RED colors) - black coffee or tea (Do NOT add milk or creamers to the coffee or tea) Do NOT drink anything that is not on this list.  In addition, your doctor has ordered for you to drink the provided  Ensure Pre-Surgery Clear Carbohydrate Drink  Drinking this carbohydrate drink up to two hours before surgery helps to reduce insulin resistance and improve patient outcomes. Please complete drinking 2 hours prior to scheduled arrival time.  DO NOT TAKE ANY MEDICATIONS THE MORNING OF SURGERY   One week prior to surgery: starting February 21 Stop Anti-inflammatories (NSAIDS) such as Advil, Aleve, Ibuprofen, Motrin, Naproxen, Naprosyn and Aspirin based products such as Excedrin, Goodys Powder, BC Powder. Stop ANY OVER THE COUNTER supplements until after surgery. You may however, continue to take Tylenol if needed for pain up until the day of surgery.  No Alcohol for 24 hours before or after surgery.  No Smoking including e-cigarettes for 24 hours prior to surgery.  No chewable tobacco products for at least 6 hours prior to surgery.  No nicotine patches on the day of surgery.  Do not use any "recreational"  drugs for at least a week prior to your surgery.  Please be advised that the combination of cocaine and anesthesia may have negative outcomes, up to and including death. If you test positive for cocaine, your surgery will be cancelled.  On the morning of surgery brush your teeth with toothpaste and water, you may rinse your mouth with mouthwash if you wish. Do not swallow any toothpaste or mouthwash.  Do not wear jewelry, make-up, hairpins, clips or nail polish.  Do not wear lotions, powders, or perfumes.   Do not shave body from the neck down 48 hours prior to surgery just in case you cut yourself which could leave a site for infection.  Also, freshly shaved skin may become irritated if using the CHG soap.  Contact lenses, hearing aids and dentures may not be worn into surgery.  Do not bring valuables to the hospital. [facility] is not responsible for any missing/lost belongings or valuables.   Notify your doctor if there is any change in your medical condition (cold, fever, infection).  Wear comfortable clothing (specific to your surgery type) to the hospital.  After surgery, you can help prevent lung complications by doing breathing exercises.  Take deep breaths and cough every 1-2 hours. Your doctor may order a device called an Incentive Spirometer to help you take deep breaths.  If you are being discharged the day of surgery, you will not be allowed to drive home. You will need a responsible adult (18 years or older) to drive you home and stay with  you that night.   If you are taking public transportation, you will need to have a responsible adult (18 years or older) with you. Please confirm with your physician that it is acceptable to use public transportation.   Please call the [facility] at [phone] if you have any questions about these instructions.  Surgery Visitation Policy:  Patients undergoing a surgery or procedure may have one family member  or support person with them as long as that person is not COVID-19 positive or experiencing its symptoms.  That person may remain in the waiting area during the procedure and may rotate out with other people.

## 2021-03-07 ENCOUNTER — Encounter: Payer: Self-pay | Admitting: Obstetrics and Gynecology

## 2021-03-08 ENCOUNTER — Encounter: Payer: Self-pay | Admitting: Obstetrics and Gynecology

## 2021-03-09 ENCOUNTER — Other Ambulatory Visit: Payer: Self-pay | Admitting: Obstetrics & Gynecology

## 2021-03-09 MED ORDER — TRAMADOL HCL 50 MG PO TABS: 50.0000 mg | ORAL_TABLET | Freq: Four times a day (QID) | ORAL | 0 refills | Status: DC | PRN

## 2021-03-09 NOTE — Telephone Encounter (Signed)
Please advise 

## 2021-03-09 NOTE — Telephone Encounter (Signed)
Symptoms described look to be related to the reason for her procedure planned on 03/13/21.  Will call in medicine for cramping, but should not take Sun or Mon prior to procedure (so take today and tomorrow only).

## 2021-03-12 MED ORDER — CHLORHEXIDINE GLUCONATE 0.12 % MT SOLN: 15.0000 mL | Freq: Once | OROMUCOSAL | Status: AC

## 2021-03-12 MED ORDER — POVIDONE-IODINE 10 % EX SWAB: 2.0000 "application " | Freq: Once | CUTANEOUS | Status: DC

## 2021-03-12 MED ORDER — FAMOTIDINE 20 MG PO TABS: 20.0000 mg | ORAL_TABLET | Freq: Once | ORAL | Status: AC

## 2021-03-12 MED ORDER — LACTATED RINGERS IV SOLN: INTRAVENOUS | Status: DC

## 2021-03-12 MED ORDER — ACETAMINOPHEN 500 MG PO TABS: 1000.0000 mg | ORAL_TABLET | Freq: Once | ORAL | Status: AC

## 2021-03-12 MED ORDER — ORAL CARE MOUTH RINSE: 15.0000 mL | Freq: Once | OROMUCOSAL | Status: AC

## 2021-03-12 NOTE — Anesthesia Preprocedure Evaluation (Addendum)
Anesthesia Evaluation  Patient identified by MRN, date of birth, ID band Patient awake    Reviewed: Allergy & Precautions, NPO status , Patient's Chart, lab work & pertinent test results  Airway Mallampati: II  TM Distance: >3 FB Neck ROM: full    Dental  (+) Missing,    Pulmonary neg pulmonary ROS,    Pulmonary exam normal        Cardiovascular negative cardio ROS Normal cardiovascular exam     Neuro/Psych negative neurological ROS  negative psych ROS   GI/Hepatic negative GI ROS, Neg liver ROS,   Endo/Other  negative endocrine ROS  Renal/GU      Musculoskeletal   Abdominal Normal abdominal exam  (+)   Peds  Hematology  (+) Blood dyscrasia, anemia ,   Anesthesia Other Findings Past Medical History: No date: Anemia No date: Anxiety No date: Bronchitis No date: Depression  Past Surgical History: No date: NO PAST SURGERIES     Reproductive/Obstetrics negative OB ROS                            Anesthesia Physical Anesthesia Plan  ASA: 3  Anesthesia Plan: General LMA   Post-op Pain Management: Tylenol PO (pre-op)* and Minimal or no pain anticipated   Induction: Intravenous  PONV Risk Score and Plan: 3 and Dexamethasone, Ondansetron and Midazolam  Airway Management Planned: LMA  Additional Equipment:   Intra-op Plan:   Post-operative Plan:   Informed Consent: I have reviewed the patients History and Physical, chart, labs and discussed the procedure including the risks, benefits and alternatives for the proposed anesthesia with the patient or authorized representative who has indicated his/her understanding and acceptance.     Dental advisory given  Plan Discussed with: Anesthesiologist, CRNA and Surgeon  Anesthesia Plan Comments:        Anesthesia Quick Evaluation

## 2021-03-13 ENCOUNTER — Ambulatory Visit: Payer: Self-pay | Admitting: Urgent Care

## 2021-03-13 ENCOUNTER — Other Ambulatory Visit: Payer: Self-pay

## 2021-03-13 ENCOUNTER — Encounter: Payer: Self-pay | Admitting: Obstetrics and Gynecology

## 2021-03-13 ENCOUNTER — Encounter
Admission: RE | Disposition: A | Discharge status: Home / Self Care | Payer: Self-pay | Source: Home / Self Care | Attending: Obstetrics and Gynecology

## 2021-03-13 ENCOUNTER — Ambulatory Visit
Admission: RE | Admit: 2021-03-13 | Discharge: 2021-03-13 | Disposition: A | Discharge status: Home / Self Care | Payer: Self-pay | Attending: Obstetrics and Gynecology | Admitting: Obstetrics and Gynecology

## 2021-03-13 DIAGNOSIS — D500 Iron deficiency anemia secondary to blood loss (chronic): Secondary | ICD-10-CM | POA: Insufficient documentation

## 2021-03-13 DIAGNOSIS — N920 Excessive and frequent menstruation with regular cycle: Secondary | ICD-10-CM | POA: Insufficient documentation

## 2021-03-13 DIAGNOSIS — N921 Excessive and frequent menstruation with irregular cycle: Secondary | ICD-10-CM

## 2021-03-13 HISTORY — PX: DILITATION & CURRETTAGE/HYSTROSCOPY WITH NOVASURE ABLATION: SHX5568

## 2021-03-13 LAB — CBC
HCT: 24.4 % — ABNORMAL LOW (ref 36.0–46.0)
Hemoglobin: 6.7 g/dL — ABNORMAL LOW (ref 12.0–15.0)
MCH: 19.1 pg — ABNORMAL LOW (ref 26.0–34.0)
MCHC: 27.5 g/dL — ABNORMAL LOW (ref 30.0–36.0)
MCV: 69.5 fL — ABNORMAL LOW (ref 80.0–100.0)
Platelets: 243 10*3/uL (ref 150–400)
RBC: 3.51 MIL/uL — ABNORMAL LOW (ref 3.87–5.11)
RDW: 19.6 % — ABNORMAL HIGH (ref 11.5–15.5)
WBC: 4.8 10*3/uL (ref 4.0–10.5)
nRBC: 0 % (ref 0.0–0.2)

## 2021-03-13 LAB — TYPE AND SCREEN
ABO/RH(D): B POS
Antibody Screen: NEGATIVE

## 2021-03-13 LAB — POCT PREGNANCY, URINE: Preg Test, Ur: NEGATIVE

## 2021-03-13 SURGERY — DILATATION & CURETTAGE/HYSTEROSCOPY WITH NOVASURE ABLATION
Anesthesia: General

## 2021-03-13 MED ORDER — CHLORHEXIDINE GLUCONATE 0.12 % MT SOLN
OROMUCOSAL | Status: AC
  Administered 2021-03-13: 15 mL via OROMUCOSAL
  Filled 2021-03-13: qty 15

## 2021-03-13 MED ORDER — ONDANSETRON HCL 4 MG/2ML IJ SOLN
INTRAMUSCULAR | Status: DC | PRN
  Administered 2021-03-13: 4 mg via INTRAVENOUS

## 2021-03-13 MED ORDER — PROPOFOL 10 MG/ML IV BOLUS
INTRAVENOUS | Status: AC
  Filled 2021-03-13: qty 20

## 2021-03-13 MED ORDER — PROPOFOL 10 MG/ML IV BOLUS
INTRAVENOUS | Status: DC | PRN
  Administered 2021-03-13: 150 mg via INTRAVENOUS

## 2021-03-13 MED ORDER — 0.9 % SODIUM CHLORIDE (POUR BTL) OPTIME
TOPICAL | Status: DC | PRN
Start: 2021-03-13 — End: 2021-03-13
  Administered 2021-03-13: 455 mL

## 2021-03-13 MED ORDER — DEXAMETHASONE SODIUM PHOSPHATE 10 MG/ML IJ SOLN
INTRAMUSCULAR | Status: AC
  Filled 2021-03-13: qty 1

## 2021-03-13 MED ORDER — MIDAZOLAM HCL 2 MG/2ML IJ SOLN
INTRAMUSCULAR | Status: DC | PRN
  Administered 2021-03-13 (×2): 1 mg via INTRAVENOUS

## 2021-03-13 MED ORDER — ONDANSETRON HCL 4 MG/2ML IJ SOLN
INTRAMUSCULAR | Status: AC
  Filled 2021-03-13: qty 2

## 2021-03-13 MED ORDER — LIDOCAINE HCL (CARDIAC) PF 100 MG/5ML IV SOSY
PREFILLED_SYRINGE | INTRAVENOUS | Status: DC | PRN
Start: 2021-03-13 — End: 2021-03-13
  Administered 2021-03-13: 60 mg via INTRAVENOUS

## 2021-03-13 MED ORDER — IBUPROFEN 600 MG PO TABS
600.0000 mg | ORAL_TABLET | Freq: Four times a day (QID) | ORAL | 0 refills | Status: DC | PRN
Start: 2021-03-13 — End: 2021-03-22

## 2021-03-13 MED ORDER — FAMOTIDINE 20 MG PO TABS
ORAL_TABLET | ORAL | Status: AC
Start: 2021-03-13 — End: 2021-03-13
  Administered 2021-03-13: 20 mg via ORAL
  Filled 2021-03-13: qty 1

## 2021-03-13 MED ORDER — DEXAMETHASONE SODIUM PHOSPHATE 10 MG/ML IJ SOLN
INTRAMUSCULAR | Status: DC | PRN
  Administered 2021-03-13: 10 mg via INTRAVENOUS

## 2021-03-13 MED ORDER — FENTANYL CITRATE (PF) 100 MCG/2ML IJ SOLN
INTRAMUSCULAR | Status: DC | PRN
  Administered 2021-03-13 (×2): 25 ug via INTRAVENOUS

## 2021-03-13 MED ORDER — LIDOCAINE HCL (PF) 2 % IJ SOLN
INTRAMUSCULAR | Status: AC
  Filled 2021-03-13: qty 5

## 2021-03-13 MED ORDER — MIDAZOLAM HCL 2 MG/2ML IJ SOLN
INTRAMUSCULAR | Status: AC
  Filled 2021-03-13: qty 2

## 2021-03-13 MED ORDER — PHENYLEPHRINE 40 MCG/ML (10ML) SYRINGE FOR IV PUSH (FOR BLOOD PRESSURE SUPPORT)
PREFILLED_SYRINGE | INTRAVENOUS | Status: DC | PRN
  Administered 2021-03-13 (×2): 80 ug via INTRAVENOUS

## 2021-03-13 MED ORDER — ACETAMINOPHEN 500 MG PO TABS
ORAL_TABLET | ORAL | Status: AC
  Administered 2021-03-13: 1000 mg via ORAL
  Filled 2021-03-13: qty 2

## 2021-03-13 MED ORDER — FENTANYL CITRATE (PF) 100 MCG/2ML IJ SOLN
INTRAMUSCULAR | Status: AC
  Filled 2021-03-13: qty 2

## 2021-03-13 SURGICAL SUPPLY — 25 items
ABLATOR SURESOUND NOVASURE (ABLATOR) ×1 IMPLANT
BACTOSHIELD CHG 4% 4OZ (MISCELLANEOUS) ×1
DEVICE MYOSURE LITE (MISCELLANEOUS) IMPLANT
DEVICE MYOSURE REACH (MISCELLANEOUS) IMPLANT
DRSG TELFA 3X8 NADH (GAUZE/BANDAGES/DRESSINGS) ×2 IMPLANT
ELECT REM PT RETURN 9FT ADLT (ELECTROSURGICAL)
ELECTRODE REM PT RTRN 9FT ADLT (ELECTROSURGICAL) IMPLANT
GAUZE 4X4 16PLY ~~LOC~~+RFID DBL (SPONGE) ×4 IMPLANT
GLOVE SURG SYN 6.5 ES PF (GLOVE) ×2 IMPLANT
GLOVE SURG SYN 6.5 PF PI (GLOVE) ×1 IMPLANT
GLOVE SURG UNDER POLY LF SZ6.5 (GLOVE) ×4 IMPLANT
GOWN STRL REUS W/ TWL LRG LVL3 (GOWN DISPOSABLE) ×2 IMPLANT
GOWN STRL REUS W/TWL LRG LVL3 (GOWN DISPOSABLE) ×4
IV NS IRRIG 3000ML ARTHROMATIC (IV SOLUTION) ×2 IMPLANT
KIT PROCEDURE FLUENT (KITS) ×1 IMPLANT
MANIFOLD NEPTUNE II (INSTRUMENTS) ×1 IMPLANT
PACK DNC HYST (MISCELLANEOUS) ×2 IMPLANT
PAD DRESSING TELFA 3X8 NADH (GAUZE/BANDAGES/DRESSINGS) IMPLANT
PAD OB MATERNITY 4.3X12.25 (PERSONAL CARE ITEMS) ×2 IMPLANT
PAD PREP 24X41 OB/GYN DISP (PERSONAL CARE ITEMS) ×2 IMPLANT
SCRUB CHG 4% DYNA-HEX 4OZ (MISCELLANEOUS) ×1 IMPLANT
SEAL ROD LENS SCOPE MYOSURE (ABLATOR) ×1 IMPLANT
SET CYSTO W/LG BORE CLAMP LF (SET/KITS/TRAYS/PACK) IMPLANT
TOWEL OR 17X26 4PK STRL BLUE (TOWEL DISPOSABLE) ×2 IMPLANT
WATER STERILE IRR 500ML POUR (IV SOLUTION) ×1 IMPLANT

## 2021-03-13 NOTE — Op Note (Addendum)
Operative Note  03/13/2021  PRE-OP DIAGNOSIS: Menorrhagia  POST-OP DIAGNOSIS: same   SURGEON: Demon Volante MD  PROCEDURE: Procedure(s): DILATATION & CURETTAGE/HYSTEROSCOPY WITH NOVASURE ABLATION   ANESTHESIA: Choice   ESTIMATED BLOOD LOSS: 5 cc   SPECIMENS:  Endometrial curetting   FLUID DEFICIT: minimal  COMPLICATIONS: None  DISPOSITION: PACU - hemodynamically stable.  CONDITION: stable  FINDINGS: Exam under anesthesia revealed  normal 10 cm uterus with bilateral adnexa without masses or fullness. Hysteroscopy revealed a normal uterine cavity with bilateral tubal ostia and normal appearing endocervical canal.  PROCEDURE IN DETAIL: After informed consent was obtained, the patient was taken to the operating room where anesthesia was obtained without difficulty. The patient was positioned in the dorsal lithotomy position in [place] stirrups. The patient's bladder was catheterized with an in and out foley catheter. The patient was examined under anesthesia, with the above noted findings. The weightedspeculum was placed inside the patient's vagina, and the the anterior lip of the cervix was seen and grasped with the tenaculum.  The uterine cavity was sounded to 10 cm, and then the cervix was progressively dilated to a 18 French-Pratt dilator. The 0 degree hysteroscope was introduced, with saline fluid used to distend the intrauterine cavity, with the above noted findings.  The hysteroscope was removed.  The uterine cavity was curetted until a gritty texture was noted, yielding endometrial curettings.   The Novasure endometrial ablation device was then placed without difficulty. Measurements were obtained. Patient was noted to have a uterine length of 6.5 cm, a cervical length of 3.5 cm, and a cervical width of 4.7 cm. The device is first tested and after confirmation the procedures performed. Length of procedure was 60 seconds. The power was 168. The ablation device is then removed and  repeat hysteroscopy revealed an appropriate lining of the uterus and no perforation or injury.   All instruments were removed, with excellent hemostasis noted throughout. She was then taken out of dorsal lithotomy. Minimal discrepancy in fluid was noted.  The patient tolerated the procedure well. Sponge, lap and needle counts were correct x2. The patient was taken to recovery room in excellent condition.  Adelene Idler MD [facility], [facility] 03/13/2021 11:07 AM

## 2021-03-13 NOTE — Interval H&P Note (Signed)
History and Physical Interval Note:  03/13/2021 9:50 AM  Susan Hall  has presented today for surgery, with the diagnosis of abnormal uterine bleeding, menorrhagia.  The various methods of treatment have been discussed with the patient and family. After consideration of risks, benefits and other options for treatment, the patient has consented to  Procedure(s): DILATATION & CURETTAGE/HYSTEROSCOPY WITH NOVASURE ABLATION (N/A) as a surgical intervention.  The patient's history has been reviewed, patient examined, no change in status, stable for surgery.  I have reviewed the patient's chart and labs.  Questions were answered to the patient's satisfaction.     Winnell Bento R Calton H[place]

## 2021-03-13 NOTE — Anesthesia Procedure Notes (Signed)
Procedure Name: LMA Insertion Date/Time: 03/13/2021 10:27 AM Performed by: Tammi Klippel, CRNA Pre-anesthesia Checklist: Patient identified, Patient being monitored, Timeout performed, Emergency Drugs available and Suction available Patient Re-evaluated:Patient Re-evaluated prior to induction Oxygen Delivery Method: Circle system utilized Preoxygenation: Pre-oxygenation with 100% oxygen Induction Type: IV induction Ventilation: Mask ventilation without difficulty LMA: LMA inserted LMA Size: 4.0 Number of attempts: 1 Placement Confirmation: positive ETCO2 Tube secured with: Tape Dental Injury: Teeth and Oropharynx as per pre-operative assessment

## 2021-03-13 NOTE — Transfer of Care (Signed)
Immediate Anesthesia Transfer of Care Note  Patient: Susan Hall  Procedure(s) Performed: DILATATION & CURETTAGE/HYSTEROSCOPY WITH NOVASURE ABLATION  Patient Location: PACU  Anesthesia Type:General  Level of Consciousness: drowsy  Airway & Oxygen Therapy: Patient Spontanous Breathing and Patient connected to face mask oxygen  Post-op Assessment: Report given to RN and Post -op Vital signs reviewed and stable  Post vital signs: Reviewed and stable  Last Vitals:  Vitals Value Taken Time  BP    Temp    Pulse    Resp    SpO2      Last Pain:  Vitals:   03/13/21 0917  TempSrc: Temporal  PainSc: 0-No pain      Patients Stated Pain Goal: 0 (03/13/21 0917)  Complications: No notable events documented.

## 2021-03-13 NOTE — Discharge Instructions (Signed)

## 2021-03-14 LAB — SURGICAL PATHOLOGY

## 2021-03-14 NOTE — Anesthesia Postprocedure Evaluation (Signed)
Anesthesia Post Note  Patient: Susan Hall  Procedure(s) Performed: [place] ABLATION  Patient location during evaluation: PACU Anesthesia Type: General Level of consciousness: awake and alert Pain management: pain level controlled Vital Signs Assessment: post-procedure vital signs reviewed and stable Respiratory status: spontaneous breathing, nonlabored ventilation and respiratory function stable Cardiovascular status: blood pressure returned to baseline and stable Postop Assessment: no apparent nausea or vomiting Anesthetic complications: no   No notable events documented.   Last Vitals:  Vitals:   03/13/21 1145 03/13/21 1224  BP: 140/68 (!) 153/57  Pulse: 65 66  Resp: 13 (!) 4  Temp: (!) 36.4 C 36.4 C  SpO2: 100% 98%    Last Pain:  Vitals:   03/13/21 1224  TempSrc: Temporal  PainSc: 0-No pain                 [place] Ouch

## 2021-03-22 ENCOUNTER — Ambulatory Visit (INDEPENDENT_AMBULATORY_CARE_PROVIDER_SITE_OTHER): Payer: Self-pay | Admitting: Obstetrics and Gynecology

## 2021-03-22 ENCOUNTER — Encounter: Payer: Self-pay | Admitting: Obstetrics and Gynecology

## 2021-03-22 ENCOUNTER — Other Ambulatory Visit: Payer: Self-pay

## 2021-03-22 VITALS — BP 120/80 | Ht 65.0 in | Wt 141.0 lb

## 2021-03-22 DIAGNOSIS — Z4889 Encounter for other specified surgical aftercare: Secondary | ICD-10-CM

## 2021-03-22 NOTE — Progress Notes (Signed)
?  Postoperative Follow-up ?Patient presents post op from  Hysteroscopy with Novasure Ablation   for  menorrhagia , 1 week ago. ? ?Subjective: ?She reports that since the surgery she has been feeling well. No bleeding, small amounts of watery discharge. Susan Hall ?Patient reports some improvement in her preop symptoms. Eating a regular diet without difficulty.  ?Pain is controlled without any medications.   ?Activity: normal activities of daily living.  ? ?Objective: ?There were no vitals taken for this visit. ?Physical Exam ?Constitutional:   ?   Appearance: Normal appearance. She is well-developed.  ?HENT:  ?   Head: Normocephalic and atraumatic.  ?Eyes:  ?   Extraocular Movements: Extraocular movements intact.  ?   Pupils: Pupils are equal, round, and reactive to light.  ?Neck:  ?   Thyroid: No thyromegaly.  ?Cardiovascular:  ?   Rate and Rhythm: Normal rate and regular rhythm.  ?   Heart sounds: Normal heart sounds.  ?Pulmonary:  ?   Effort: Pulmonary effort is normal.  ?   Breath sounds: Normal breath sounds.  ?Abdominal:  ?   General: Bowel sounds are normal. There is no distension.  ?   Palpations: Abdomen is soft. There is no mass.  ?Musculoskeletal:  ?   Cervical back: Neck supple.  ?Neurological:  ?   Mental Status: She is alert and oriented to person, place, and time.  ?Skin: ?   General: Skin is warm and dry.  ?Psychiatric:     ?   Behavior: Behavior normal.     ?   Thought Content: Thought content normal.     ?   Judgment: Judgment normal.  ?Vitals reviewed.  ? ? ?Assessment: ?s/p :  Hysteroscopy with Novasure ablation  stable ? ?Plan: ? ?Patient has done well after surgery with no apparent complications.  ? ?I have discussed the post-operative course to date, and the expected progress moving forward.  The patient understands what complications to be concerned about.  I will see the patient in routine follow up, or sooner if needed.   ? ?Activity plan: Return to normal activities once she is 2 weeks post op.   ? ? ?Adelene Idler MD, ?[facility], [facility] ?03/22/2021 ?9:29 AM ? ? ? ?

## 2021-05-25 ENCOUNTER — Ambulatory Visit (INDEPENDENT_AMBULATORY_CARE_PROVIDER_SITE_OTHER): Payer: Self-pay | Admitting: Licensed Practical Nurse

## 2021-05-25 ENCOUNTER — Other Ambulatory Visit (HOSPITAL_COMMUNITY)
Admission: RE | Admit: 2021-05-25 | Discharge: 2021-05-25 | Disposition: A | Discharge status: Home / Self Care | Payer: Self-pay | Source: Ambulatory Visit | Attending: Licensed Practical Nurse | Admitting: Licensed Practical Nurse

## 2021-05-25 VITALS — BP 118/62 | Ht 65.0 in | Wt 138.0 lb

## 2021-05-25 DIAGNOSIS — N898 Other specified noninflammatory disorders of vagina: Secondary | ICD-10-CM | POA: Insufficient documentation

## 2021-05-27 NOTE — Progress Notes (Signed)
?HPI: ?     [name] is a 51 y.o. [id number] who LMP was No LMP recorded., presents today for a problem visit.  She complains of: ? ?Vaginitis: Patient complains of an abnormal vaginal discharge  since her D and C in February. Vaginal symptoms include yellow discharge without an odor-she needs to wear and change panty liners frequently.  vulvar itching and swelling .STI Risk: Very low risk of STD exposureDischarge .Menstrual pattern: Since her D and C she has not had bleeding but gets PMS symptoms (HA and feeling grumpy) monthly. . Contraception: none. To help with the vaginal discharge she has been not wearing under wear for a few days.  ? ?She has been experiencing burring, increased urgency and frequency for about 2 weeks, she has treated it  with D-mannose and cranberry and feels it is helping.  ? ?Susan Hall currently sees an herbalist for treatment for various symptoms management including perimenopausal symptoms.  ? ?During physical exam cystocele noted, pt reports she has had that for some time and it has not caused her any problems. But does admit to having discomfort with IC.  ? ?PMHx: ?She  has a past medical history of Anemia, Anxiety, Bronchitis, and Depression. Also,  has a past surgical history that includes No past surgeries and Dilatation & currettage/hysteroscopy with novasure ablation (N/A, 03/13/2021)., family history includes Alcohol abuse in her paternal grandfather; Arthritis in her paternal grandmother; Depression in her mother; Heart attack in her maternal grandfather; Heart attack (age of onset: 70) in her father; Heart disease in her maternal grandmother; Hyperlipidemia in her maternal grandmother; Hypertension in her maternal grandmother and mother; Mental illness in her mother.,  reports that she has never smoked. She has never used smokeless tobacco. She reports that she does not currently use alcohol. She reports that she does not use drugs. ? ?She currently has no medications in  their medication list. Also, is allergic to doxycycline, erythromycin, and penicillins. ? ?Review of Systems  ?Constitutional: Negative.   ?Gastrointestinal:  Negative for abdominal pain and constipation.  ?Genitourinary:  Positive for dysuria, frequency and urgency.  ?     Vaginal discharge  ?Denies leakage of urine ?Does have occasional vaginal/pelvic fullness   ?Neurological: Negative.   ?Psychiatric/Behavioral: Negative.    ? ?Objective: ?BP 118/62   Ht 5\' 5"  (1.651 m)   Wt 138 lb (62.6 kg)   BMI 22.96 kg/m?  ?Physical Exam ?Constitutional:   ?   Appearance: Normal appearance.  ?Genitourinary:  ?   Genitourinary Comments: Vulva: slightly pale in color, labia with some atrophy, a possible healing lesion present on perineum-not tender to touch.  No swelling or redness noted  ?-With pt reclined and knees open, bladder protrudes to introitus  ?-Spec exam: cervix pale pink, no lesions, no discharge noted, cervix noted near introitus once speculum removed than easily repositioned to anatomic position.   ?Neurological:  ?   Mental Status: She is alert.  ? ? ?ASSESSMENT/PLAN:   ? ?Problem List Items Addressed This Visit   ?None ?Visit Diagnoses   ? ? Vaginal discharge    -  Primary  ? Relevant Orders  ? Cervicovaginal ancillary only  ? ?  ? ?Discussed her symptoms could be associated with perimenopause, perhaps the discharge she is experiencing is leakage of urine.  ? ?-Cystocele:  Information given on pessaries, if pt desires treatment pt will schedule an appointment with Dr Amalia Hailey.  ?-Vaginal atrophy:pt to consult with her herbalist for treatment, recommend  increasing soy based foods.  ?-Urinary symptoms: declines giving urine sample as she is being treated by her holistic practitioner and feels that her symptoms have improved, states she prefers to avoid antibiotics.  ? ?Susan Hall, CNM  ?Susan Hall, [facility]  ?05/27/21  ?9:26 PM  ? ? ?  ?

## 2021-05-29 LAB — CERVICOVAGINAL ANCILLARY ONLY
Bacterial Vaginitis (gardnerella): NEGATIVE
Candida Glabrata: NEGATIVE
Candida Vaginitis: POSITIVE — AB
Comment: NEGATIVE
Comment: NEGATIVE
Comment: NEGATIVE

## 2021-05-30 ENCOUNTER — Encounter: Payer: Self-pay | Admitting: Licensed Practical Nurse

## 2022-01-30 IMAGING — CR DG CHEST 2V
2 series · 2 of 2 positions shown · non-contrast
Comparison: None.

CLINICAL DATA: Shortness of breath

EXAM:
CHEST - 2 VIEW

[chest pa]
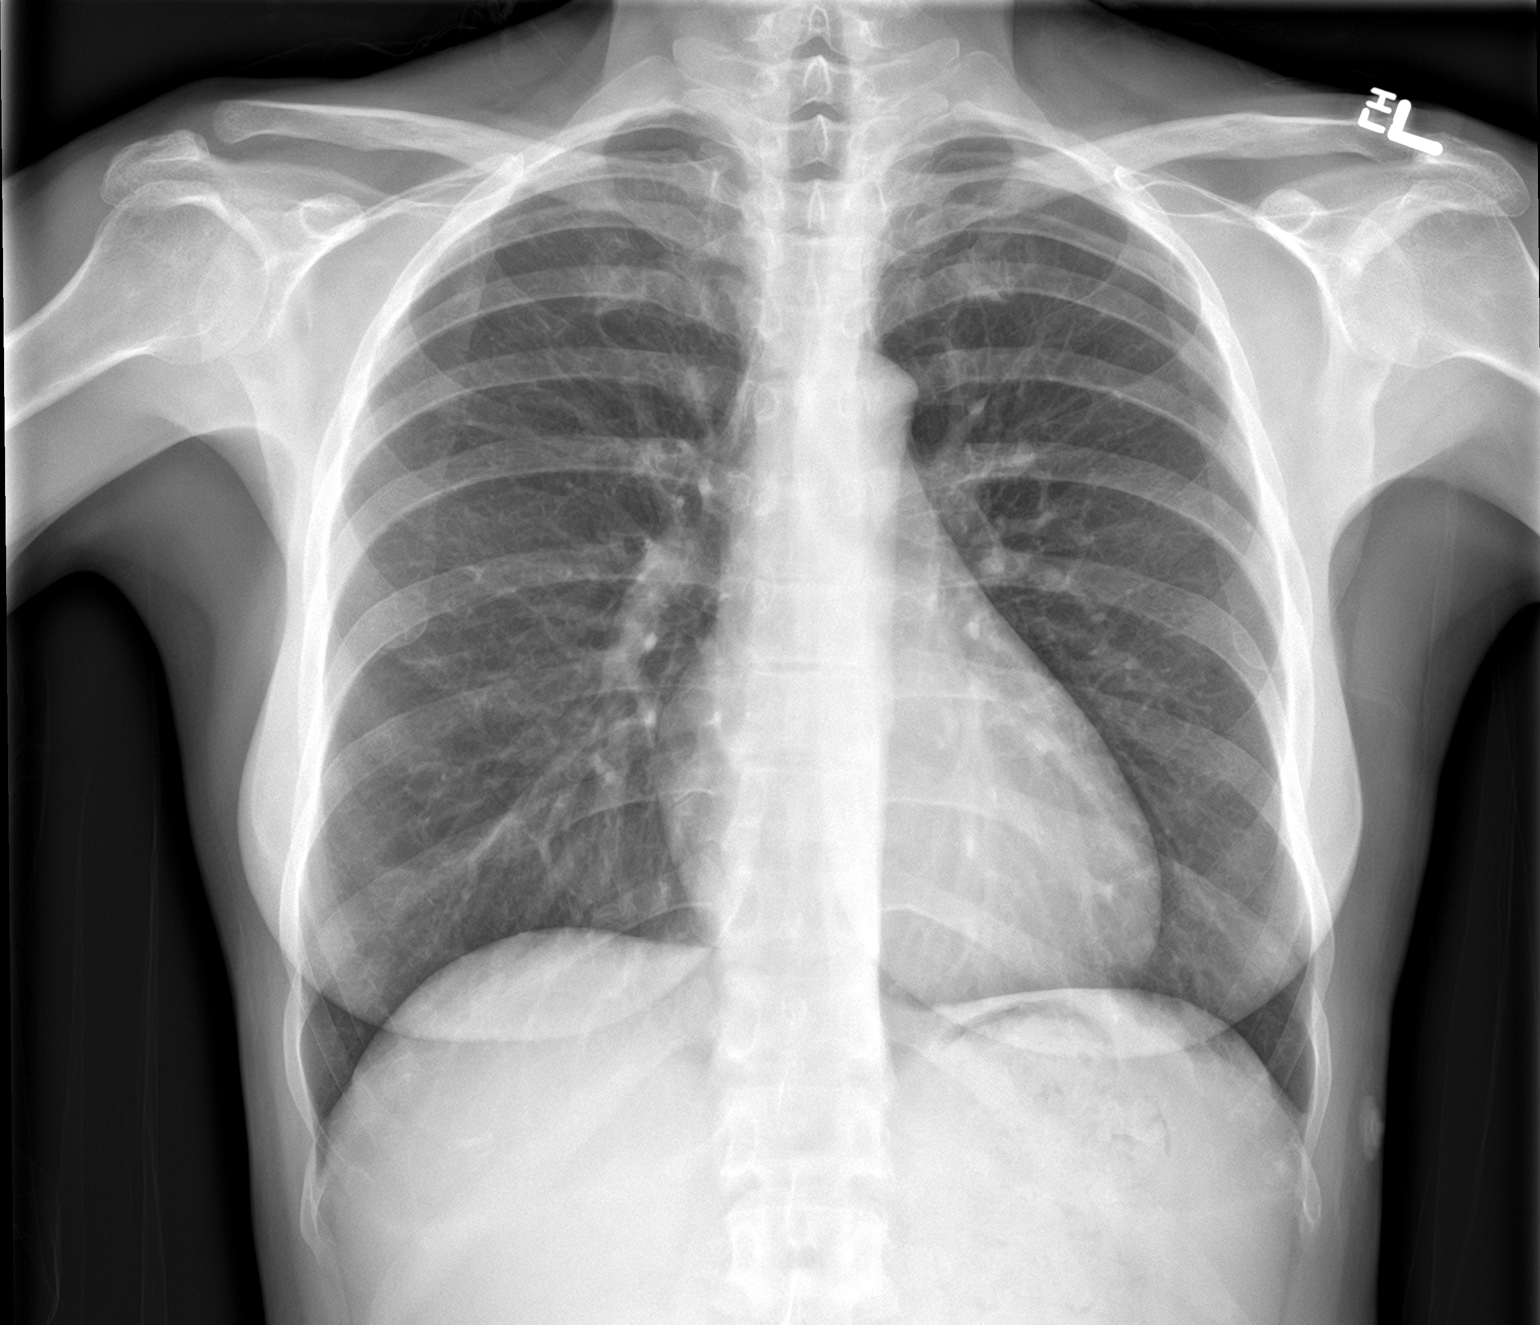

[chest lat]
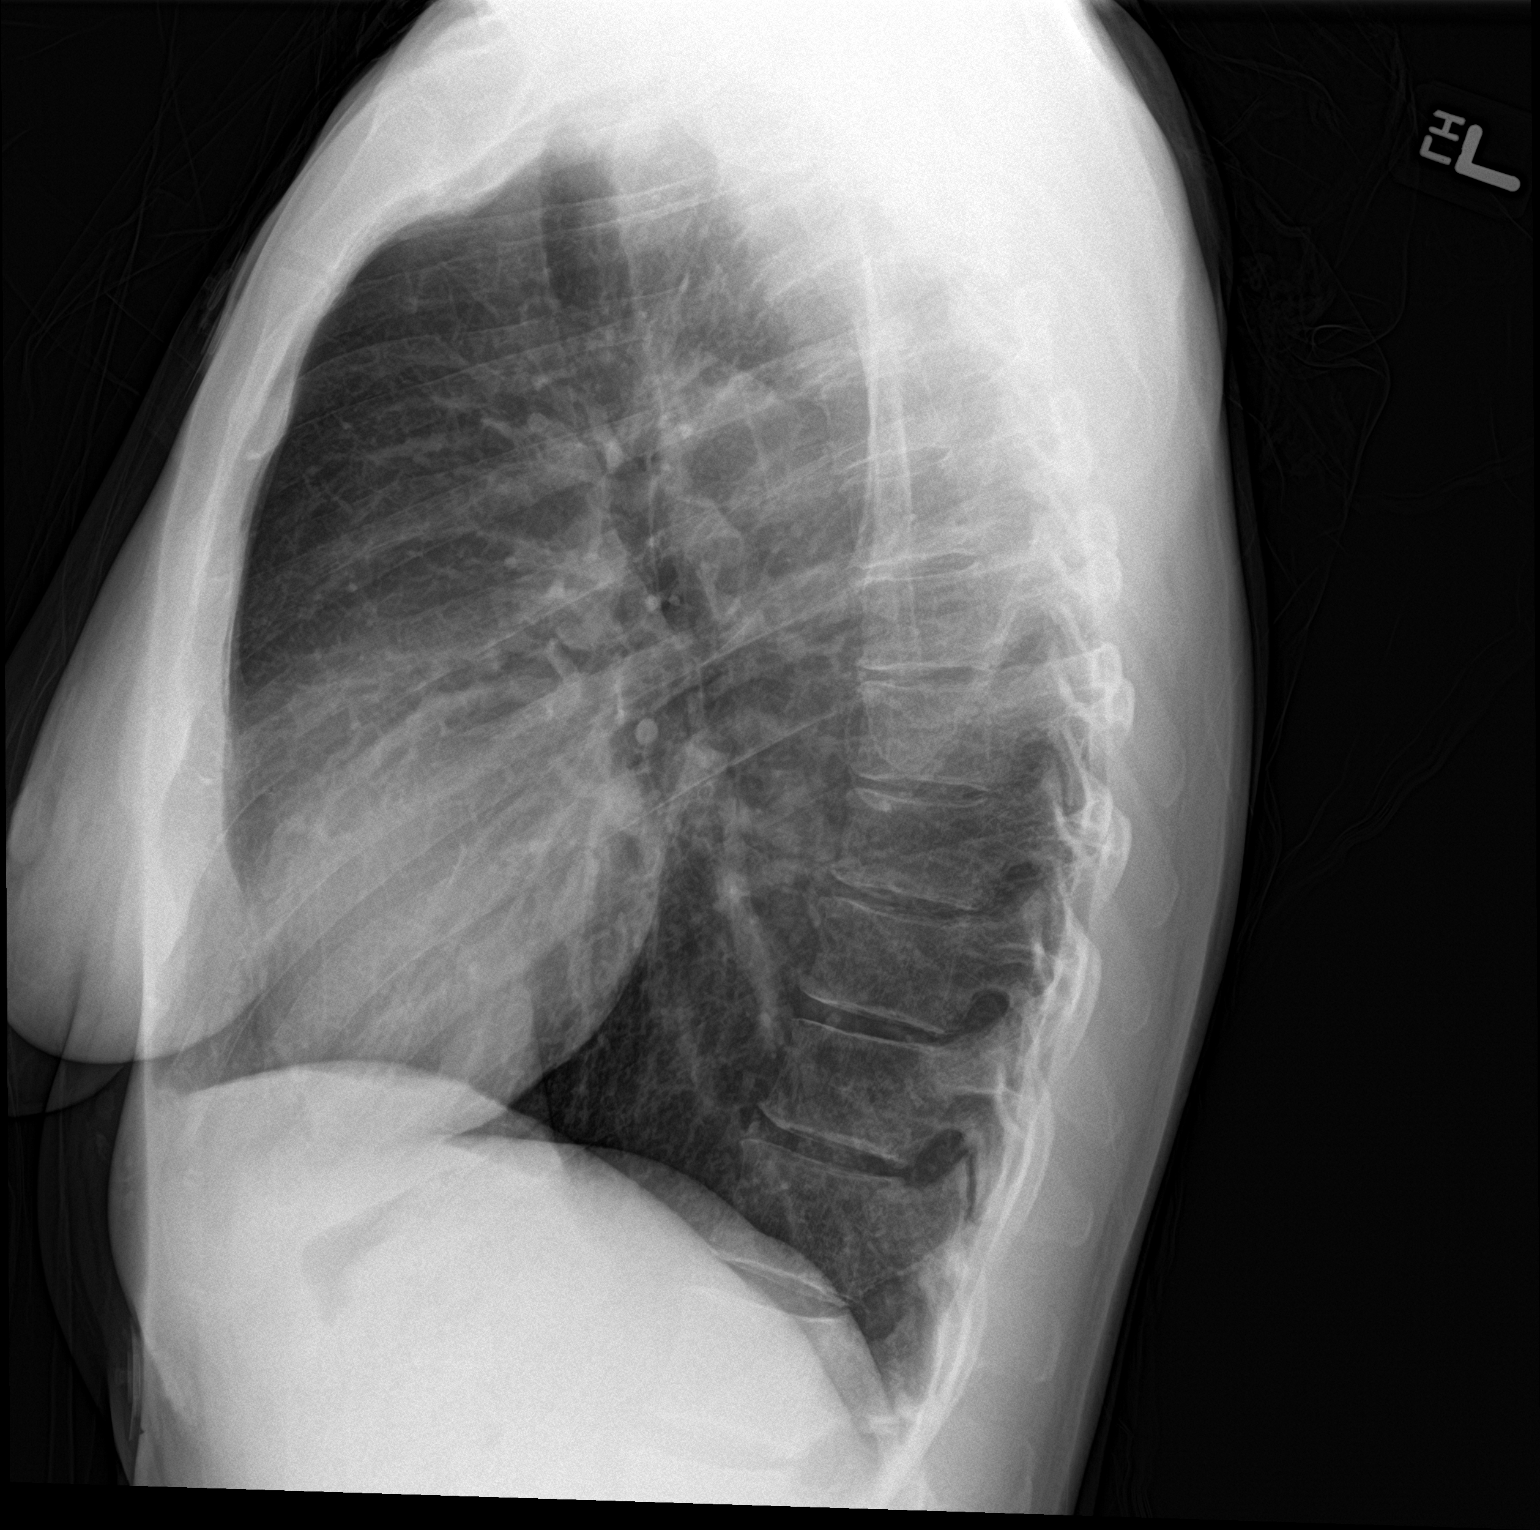

[2 of 2 positions shown; findings below may reference images not displayed]

FINDINGS: The heart size and mediastinal contours are within normal limits. No
focal consolidation. No pleural effusion. No pneumothorax. The
visualized skeletal structures are unremarkable.
IMPRESSION: No active cardiopulmonary disease.

## 2022-02-09 IMAGING — US US 3D RECON AT US UNIT
1 series · 13 of 16 positions shown · non-contrast
Comparison: None

CLINICAL DATA: Abnormal uterine bleeding for 6 months, on Provera
for 10 days, LMP 01/21/2021

EXAM:
TRANSABDOMINAL AND TRANSVAGINAL ULTRASOUND OF PELVIS AND 3D
ULTRASOUND IMAGE RECONSTRUCTION
TECHNIQUE: Both transabdominal and transvaginal ultrasound examinations of the
pelvis were performed. Transabdominal technique was performed for
global imaging of the pelvis including uterus, ovaries, adnexal
regions, and pelvic cul-de-sac. It was necessary to proceed with
endovaginal exam following the transabdominal exam to visualize the
endometrium and ovaries. 3D ultrasound of the uterus was also
performed, with multiplanar image reconstructions.

[Series 1: us pelvic complete with transvaginal · 13 of 135 slices shown]
[im 1/135]
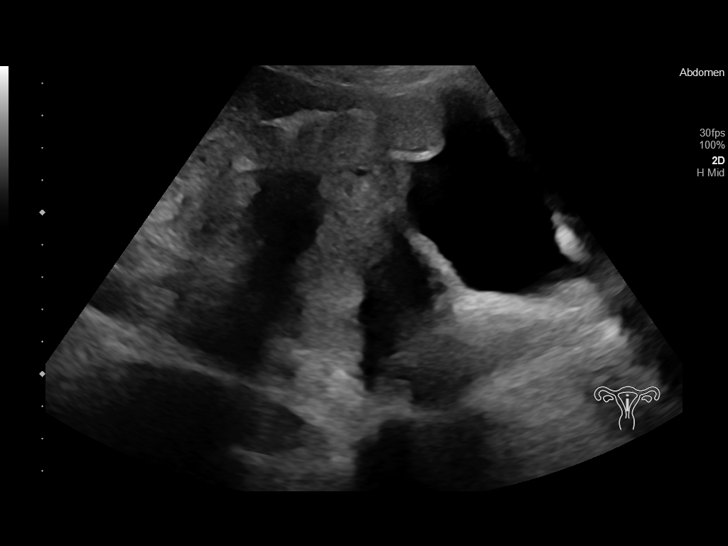
[im 9/135]
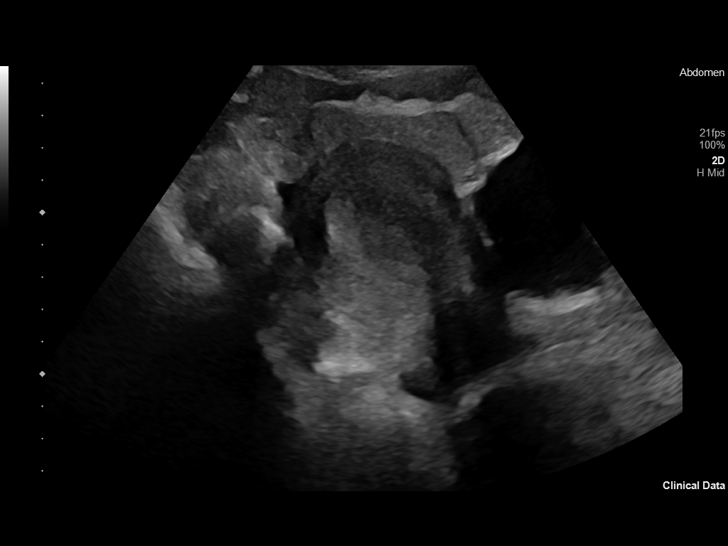
[im 27/135]
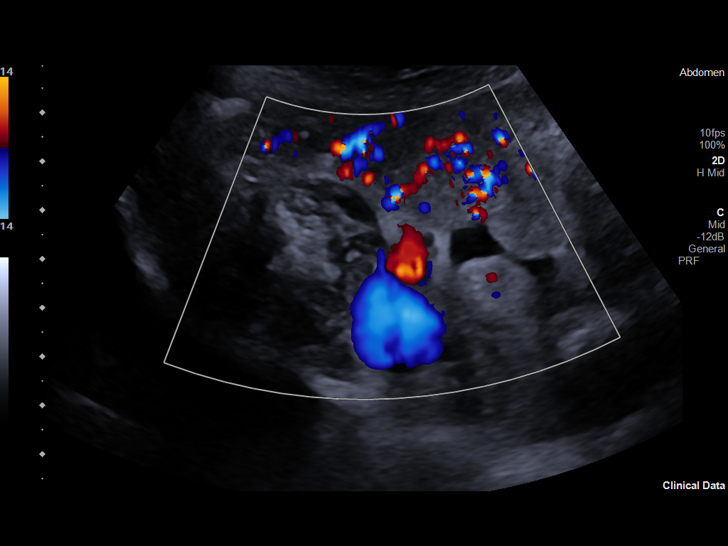
[im 36/135]
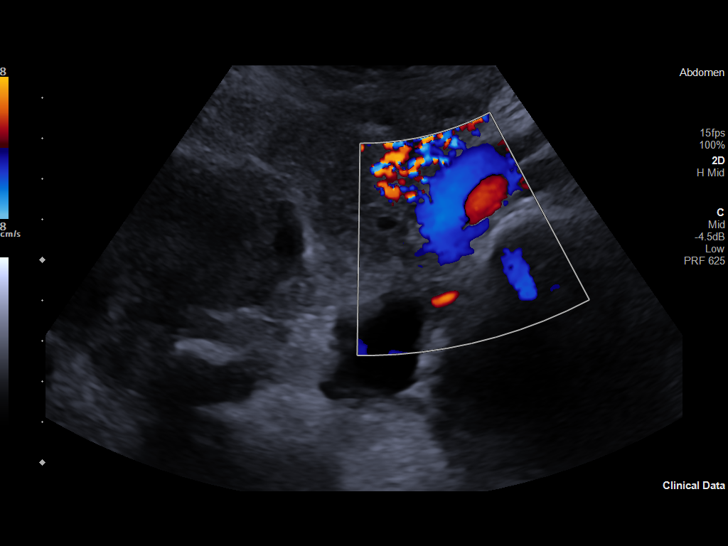
[im 45/135]
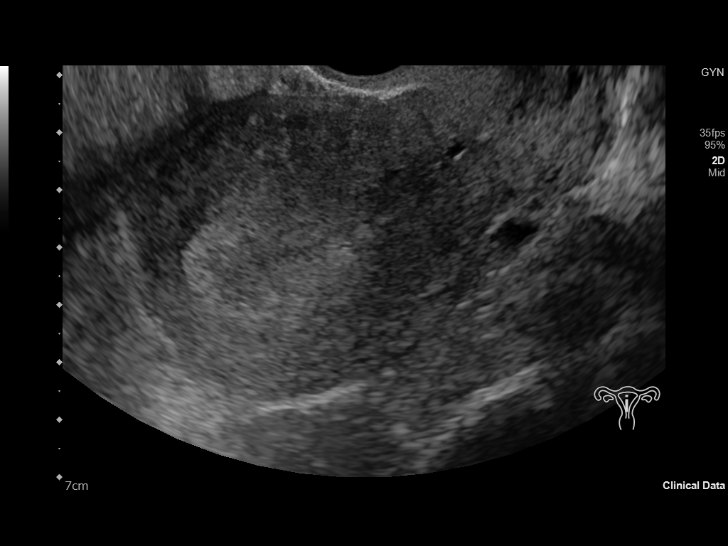
[im 54/135]
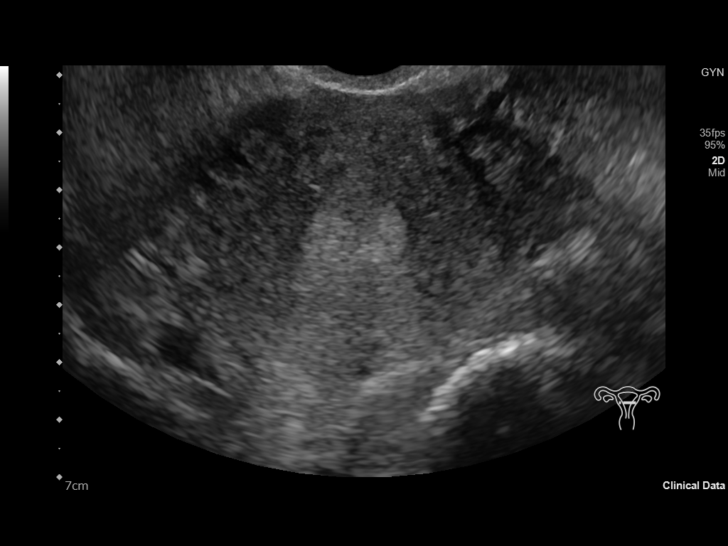
[im 72/135]
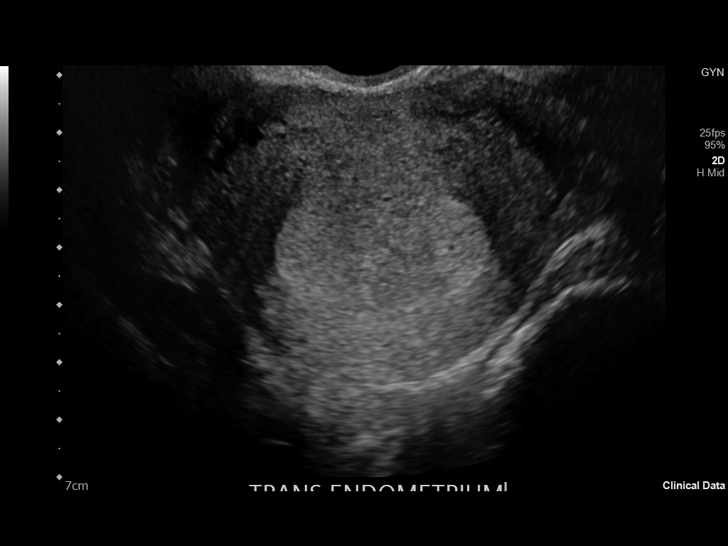
[im 81/135]
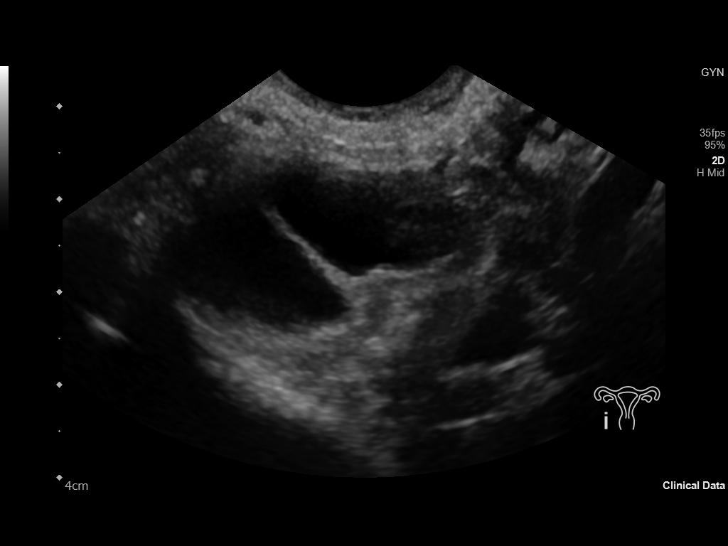
[im 90/135]
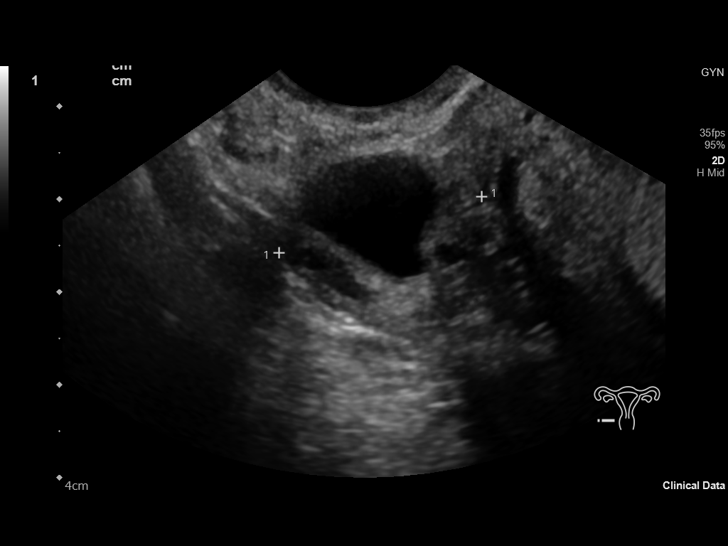
[im 99/135]
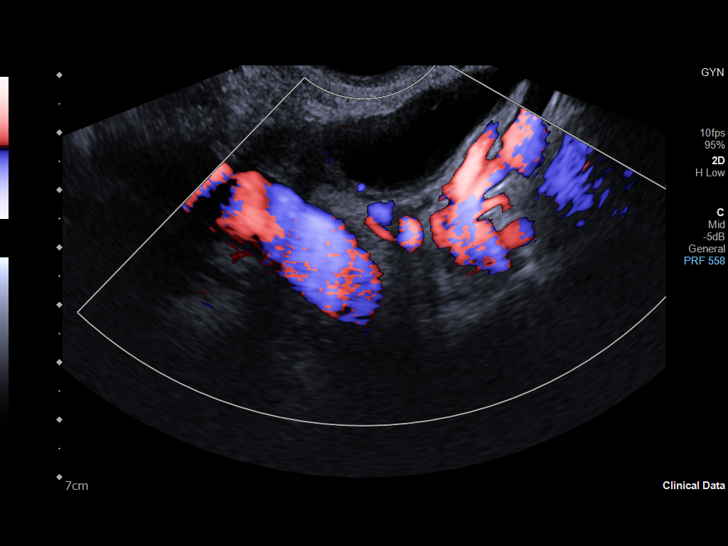
[im 108/135]
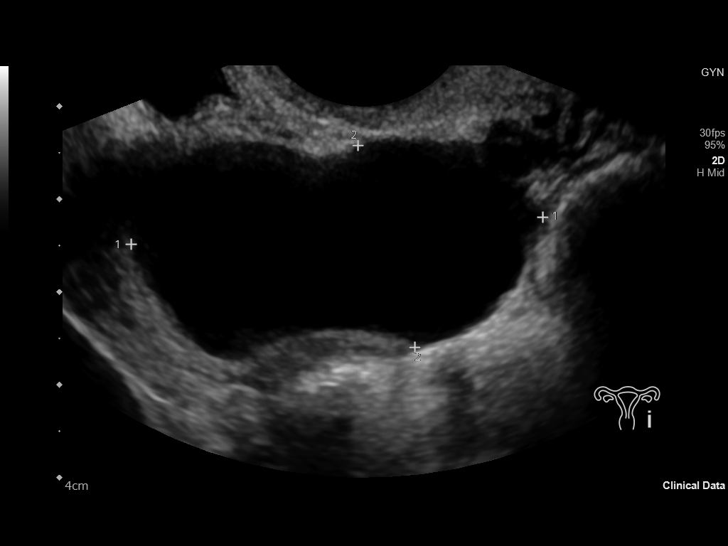
[im 126/135]
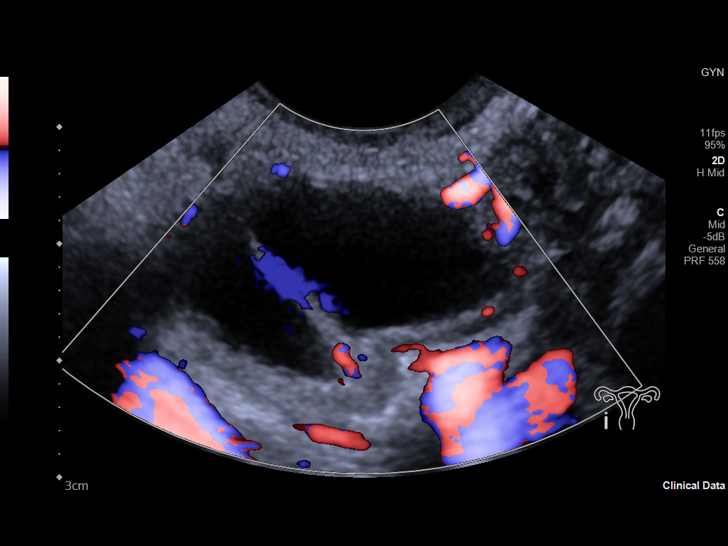
[im 135/135]
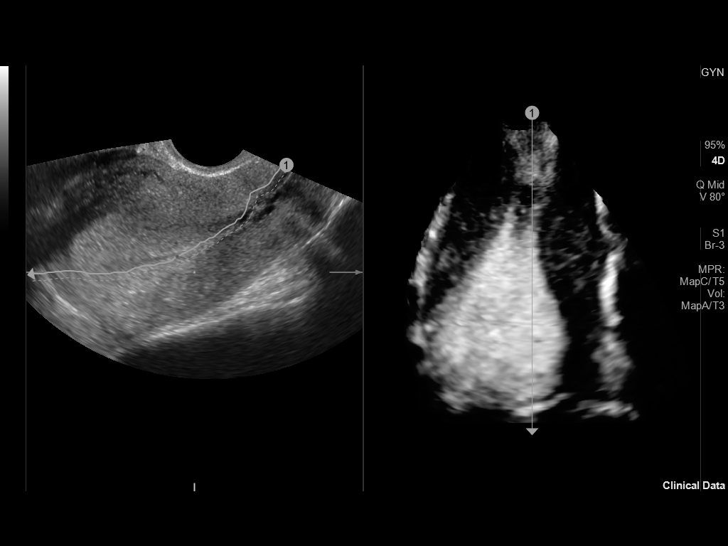

[13 of 16 positions shown; findings below may reference images not displayed]

FINDINGS: Uterus

Measurements: 9.0 x 5.4 x 6.4 cm = volume: 163 mL. Anteverted.
Normal morphology without mass

Endometrium

Thickness: 24 mm. Abnormally thickened, mildly heterogeneous. No
discrete mass or fluid.

Right ovary

Measurements: 3.9 x 2.0 x 2.3 cm = volume: 9.1 mL. Two dominant
follicles, larger 2.4 cm diameter; no follow-up imaging recommended.
No additional masses.

Left ovary

Measurements: 5.3 x 2.7 x 3.7 cm = volume: 27.5 mL. Simple cyst LEFT
ovary 4.5 x 3.1 x 2.3 cm; no follow-up imaging recommended.

Other findings

Small amount of nonspecific free pelvic fluid.  No adnexal masses.

Review of the 3D ultrasound image reconstructions of the uterus
confirms the above findings.
IMPRESSION: Abnormally thickened and heterogeneous endometrial complex, 24 mm
thick; endometrial thickness is abnormal for a patient with abnormal
uterine bleeding; if bleeding remains unresponsive to hormonal or
medical therapy, focal lesion work-up with sonohysterogram should be
considered. Endometrial biopsy should also be considered in
pre-menopausal patients at high risk for endometrial carcinoma.
(Ref: Radiological Reasoning: Algorithmic Workup of Abnormal Vaginal
Bleeding with Endovaginal Sonography and Sonohysterography. AJR
[id number]; 191:[id number])

BILATERAL simple appearing ovarian cysts, 4.5 cm diameter on LEFT
and 2.4 cm diameter on RIGHT; no follow-up imaging recommended.

No other pelvic sonographic abnormalities.
